# Patient Record
Sex: Male | Born: 1935 | Race: Black or African American | Hispanic: No | Marital: Married | Smoking: Current every day smoker
Health system: Southern US, Community
[De-identification: ages and names within clinical notes are randomized; demographics above are authoritative.]

## PROBLEM LIST (undated history)

## (undated) DIAGNOSIS — H269 Unspecified cataract: Secondary | ICD-10-CM

## (undated) DIAGNOSIS — C801 Malignant (primary) neoplasm, unspecified: Secondary | ICD-10-CM

## (undated) DIAGNOSIS — E785 Hyperlipidemia, unspecified: Secondary | ICD-10-CM

## (undated) DIAGNOSIS — I10 Essential (primary) hypertension: Secondary | ICD-10-CM

## (undated) HISTORY — DX: Hyperlipidemia, unspecified: E78.5

## (undated) HISTORY — PX: FRACTURE SURGERY: SHX138

## (undated) HISTORY — DX: Malignant (primary) neoplasm, unspecified: C80.1

## (undated) HISTORY — PX: CHOLECYSTECTOMY: SHX55

## (undated) HISTORY — PX: APPENDECTOMY: SHX54

## (undated) HISTORY — DX: Unspecified cataract: H26.9

---

## 2001-11-08 ENCOUNTER — Encounter: Payer: Self-pay | Admitting: Emergency Medicine

## 2001-11-08 ENCOUNTER — Encounter: Payer: Self-pay | Admitting: Internal Medicine

## 2001-11-08 ENCOUNTER — Inpatient Hospital Stay (HOSPITAL_COMMUNITY): Admission: EM | Admit: 2001-11-08 | Discharge: 2001-11-13 | Payer: Self-pay | Admitting: Emergency Medicine

## 2001-11-11 ENCOUNTER — Encounter: Payer: Self-pay | Admitting: Internal Medicine

## 2008-11-22 ENCOUNTER — Emergency Department (HOSPITAL_COMMUNITY): Admission: EM | Admit: 2008-11-22 | Discharge: 2008-11-23 | Payer: Self-pay | Admitting: Emergency Medicine

## 2010-01-04 ENCOUNTER — Ambulatory Visit (HOSPITAL_COMMUNITY): Payer: Self-pay | Admitting: Oncology

## 2010-01-04 ENCOUNTER — Encounter (HOSPITAL_COMMUNITY)
Admission: RE | Admit: 2010-01-04 | Discharge: 2010-02-03 | Payer: Self-pay | Source: Home / Self Care | Admitting: Oncology

## 2010-01-10 ENCOUNTER — Ambulatory Visit
Admission: RE | Admit: 2010-01-10 | Discharge: 2010-04-05 | Payer: Self-pay | Source: Home / Self Care | Attending: Radiation Oncology | Admitting: Radiation Oncology

## 2010-03-12 ENCOUNTER — Inpatient Hospital Stay (HOSPITAL_COMMUNITY)
Admission: EM | Admit: 2010-03-12 | Discharge: 2010-03-16 | Payer: Self-pay | Source: Home / Self Care | Attending: Family Medicine | Admitting: Family Medicine

## 2010-03-21 LAB — BASIC METABOLIC PANEL
BUN: 19 mg/dL (ref 6–23)
BUN: 19 mg/dL (ref 6–23)
BUN: 19 mg/dL (ref 6–23)
BUN: 13 mg/dL (ref 6–23)
CO2: 34 mEq/L — ABNORMAL HIGH (ref 19–32)
CO2: 32 mEq/L (ref 19–32)
CO2: 30 mEq/L (ref 19–32)
CO2: 31 mEq/L (ref 19–32)
Calcium: 10.6 mg/dL — ABNORMAL HIGH (ref 8.4–10.5)
Calcium: 10.1 mg/dL (ref 8.4–10.5)
Calcium: 9.8 mg/dL (ref 8.4–10.5)
Calcium: 8.6 mg/dL (ref 8.4–10.5)
Chloride: 84 mEq/L — ABNORMAL LOW (ref 96–112)
Chloride: 89 mEq/L — ABNORMAL LOW (ref 96–112)
Chloride: 90 mEq/L — ABNORMAL LOW (ref 96–112)
Chloride: 101 mEq/L (ref 96–112)
Creatinine, Ser: 1.46 mg/dL (ref 0.4–1.5)
Creatinine, Ser: 1.38 mg/dL (ref 0.4–1.5)
Creatinine, Ser: 1.1 mg/dL (ref 0.4–1.5)
Creatinine, Ser: 0.99 mg/dL (ref 0.4–1.5)
GFR calc Af Amer: 57 mL/min — ABNORMAL LOW (ref >60)
GFR calc Af Amer: >60 mL/min (ref >60)
GFR calc Af Amer: >60 mL/min (ref >60)
GFR calc Af Amer: >60 mL/min (ref >60)
GFR calc non Af Amer: 47 mL/min — ABNORMAL LOW (ref >60)
GFR calc non Af Amer: 51 mL/min — ABNORMAL LOW (ref >60)
GFR calc non Af Amer: >60 mL/min (ref >60)
GFR calc non Af Amer: >60 mL/min (ref >60)
Glucose, Bld: 537 mg/dL — ABNORMAL HIGH (ref 70–99)
Glucose, Bld: 246 mg/dL — ABNORMAL HIGH (ref 70–99)
Glucose, Bld: 235 mg/dL — ABNORMAL HIGH (ref 70–99)
Glucose, Bld: 177 mg/dL — ABNORMAL HIGH (ref 70–99)
Potassium: 4.5 mEq/L (ref 3.5–5.1)
Potassium: 3.4 mEq/L — ABNORMAL LOW (ref 3.5–5.1)
Potassium: 4 mEq/L (ref 3.5–5.1)
Potassium: 4.8 mEq/L (ref 3.5–5.1)
Sodium: 129 mEq/L — ABNORMAL LOW (ref 135–145)
Sodium: 134 mEq/L — ABNORMAL LOW (ref 135–145)
Sodium: 131 mEq/L — ABNORMAL LOW (ref 135–145)
Sodium: 137 mEq/L (ref 135–145)

## 2010-03-21 LAB — CBC
HCT: 42.6 % (ref 39.0–52.0)
Hemoglobin: 15.6 g/dL (ref 13.0–17.0)
MCH: 31.8 pg (ref 26.0–34.0)
MCHC: 36.6 g/dL — ABNORMAL HIGH (ref 30.0–36.0)
MCV: 86.9 fL (ref 78.0–100.0)
Platelets: 210 10*3/uL (ref 150–400)
RBC: 4.9 MIL/uL (ref 4.22–5.81)
RDW: 12.2 % (ref 11.5–15.5)
WBC: 10.2 10*3/uL (ref 4.0–10.5)

## 2010-03-21 LAB — HEPATIC FUNCTION PANEL
ALT: 15 U/L (ref 0–53)
AST: 17 U/L (ref 0–37)
Albumin: 3.8 g/dL (ref 3.5–5.2)
Alkaline Phosphatase: 86 U/L (ref 39–117)
Bilirubin, Direct: 0.2 mg/dL (ref 0.0–0.3)
Indirect Bilirubin: 0.8 mg/dL (ref 0.3–0.9)
Total Bilirubin: 1 mg/dL (ref 0.3–1.2)
Total Protein: 7.5 g/dL (ref 6.0–8.3)

## 2010-03-21 LAB — DIFFERENTIAL
Basophils Absolute: 0 10*3/uL (ref 0.0–0.1)
Basophils Relative: 0 % (ref 0–1)
Eosinophils Absolute: 0.1 10*3/uL (ref 0.0–0.7)
Eosinophils Relative: 1 % (ref 0–5)
Lymphocytes Relative: 15 % (ref 12–46)
Lymphs Abs: 1.5 10*3/uL (ref 0.7–4.0)
Monocytes Absolute: 0.5 10*3/uL (ref 0.1–1.0)
Monocytes Relative: 5 % (ref 3–12)
Neutro Abs: 8.1 10*3/uL — ABNORMAL HIGH (ref 1.7–7.7)
Neutrophils Relative %: 79 % — ABNORMAL HIGH (ref 43–77)

## 2010-03-21 LAB — GLUCOSE, CAPILLARY
Glucose-Capillary: >600 mg/dL (ref 70–99)
Glucose-Capillary: 398 mg/dL — ABNORMAL HIGH (ref 70–99)
Glucose-Capillary: 270 mg/dL — ABNORMAL HIGH (ref 70–99)
Glucose-Capillary: 206 mg/dL — ABNORMAL HIGH (ref 70–99)
Glucose-Capillary: 191 mg/dL — ABNORMAL HIGH (ref 70–99)
Glucose-Capillary: 194 mg/dL — ABNORMAL HIGH (ref 70–99)
Glucose-Capillary: 231 mg/dL — ABNORMAL HIGH (ref 70–99)
Glucose-Capillary: 120 mg/dL — ABNORMAL HIGH (ref 70–99)
Glucose-Capillary: 241 mg/dL — ABNORMAL HIGH (ref 70–99)
Glucose-Capillary: 321 mg/dL — ABNORMAL HIGH (ref 70–99)
Glucose-Capillary: 342 mg/dL — ABNORMAL HIGH (ref 70–99)
Glucose-Capillary: 141 mg/dL — ABNORMAL HIGH (ref 70–99)
Glucose-Capillary: 233 mg/dL — ABNORMAL HIGH (ref 70–99)
Glucose-Capillary: 273 mg/dL — ABNORMAL HIGH (ref 70–99)
Glucose-Capillary: 204 mg/dL — ABNORMAL HIGH (ref 70–99)
Glucose-Capillary: 208 mg/dL — ABNORMAL HIGH (ref 70–99)
Glucose-Capillary: 137 mg/dL — ABNORMAL HIGH (ref 70–99)

## 2010-03-21 LAB — URINE MICROSCOPIC-ADD ON

## 2010-03-21 LAB — COMPREHENSIVE METABOLIC PANEL
ALT: 11 U/L (ref 0–53)
AST: 16 U/L (ref 0–37)
Albumin: 2.5 g/dL — ABNORMAL LOW (ref 3.5–5.2)
Alkaline Phosphatase: 54 U/L (ref 39–117)
BUN: 6 mg/dL (ref 6–23)
CO2: 28 mEq/L (ref 19–32)
Calcium: 8.4 mg/dL (ref 8.4–10.5)
Chloride: 104 mEq/L (ref 96–112)
Creatinine, Ser: 0.78 mg/dL (ref 0.4–1.5)
GFR calc Af Amer: >60 mL/min (ref >60)
GFR calc non Af Amer: >60 mL/min (ref >60)
Glucose, Bld: 228 mg/dL — ABNORMAL HIGH (ref 70–99)
Potassium: 4.4 mEq/L (ref 3.5–5.1)
Sodium: 136 mEq/L (ref 135–145)
Total Bilirubin: 0.7 mg/dL (ref 0.3–1.2)
Total Protein: 5 g/dL — ABNORMAL LOW (ref 6.0–8.3)

## 2010-03-21 LAB — HEMOGLOBIN A1C
Hgb A1c MFr Bld: 16.6 % — ABNORMAL HIGH (ref <5.7)
Mean Plasma Glucose: 430 mg/dL — ABNORMAL HIGH (ref <117)

## 2010-03-21 LAB — URINALYSIS, ROUTINE W REFLEX MICROSCOPIC
Bilirubin Urine: NEGATIVE
Hgb urine dipstick: NEGATIVE
Leukocytes, UA: NEGATIVE
Nitrite: NEGATIVE
Protein, ur: NEGATIVE mg/dL
Specific Gravity, Urine: 1.015 (ref 1.005–1.030)
Urine Glucose, Fasting: >1000 mg/dL — AB
Urobilinogen, UA: 0.2 mg/dL (ref 0.0–1.0)
pH: 5.5 (ref 5.0–8.0)

## 2010-03-21 LAB — CK: Total CK: 105 U/L (ref 7–232)

## 2010-03-21 LAB — LIPASE, BLOOD: Lipase: 23 U/L (ref 11–59)

## 2010-03-21 LAB — RPR: RPR Ser Ql: NONREACTIVE

## 2010-03-21 LAB — PSA: PSA: 0.97 ng/mL (ref <=4.00)

## 2010-03-21 LAB — VITAMIN B12: Vitamin B-12: 234 pg/mL (ref 211–911)

## 2010-03-21 LAB — TSH: TSH: 1.156 u[IU]/mL (ref 0.350–4.500)

## 2010-03-21 LAB — KETONES, QUALITATIVE: Acetone, Bld: NEGATIVE

## 2010-04-06 ENCOUNTER — Ambulatory Visit: Payer: Medicare Other | Attending: Radiation Oncology | Admitting: Radiation Oncology

## 2010-04-06 DIAGNOSIS — Z510 Encounter for antineoplastic radiation therapy: Secondary | ICD-10-CM | POA: Insufficient documentation

## 2010-04-06 DIAGNOSIS — C61 Malignant neoplasm of prostate: Secondary | ICD-10-CM | POA: Insufficient documentation
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
## 2010-04-06 NOTE — Discharge Summary (Signed)
  NAME:  Jerry West, Jerry West NO.:  [id number]  MEDICAL RECORD NO.:  [id number]          PATIENT TYPE:  INP  LOCATION:  A330                          FACILITY:  [facility]  PHYSICIAN:  Melvyn Novas, MDDATE OF BIRTH:  [birth year]  DATE OF ADMISSION:  03/12/2010 DATE OF DISCHARGE:  01/11/2012LH                              DISCHARGE SUMMARY   The patient is a 76 year old black male with chronic noncompliance, prostatic carcinoma, undergoing radiation therapy which started approximately a week or 2 ago, type 2 diabetes, and history of hypertension.  The patient underwent his first radiation therapy with Dr. Dayton Scrape in [place], decided to check his glucose every year, he has stopped metformin a year ago, and apparently had glucose over 600, subsequently admitted to the hospital, who was found to be significantly hyperglycemic with nonketotic features.  He has polyuria and polydipsia. He also had a 35-pound inexplicable weight loss over the preceding year. Upper and lower endoscopy which revealed esophagitis and two polyps in transverse colon with some mild sigmoid diverticulosis.  There was no evidence of carcinoma.  Essentially, labs were within normal limits.  He was placed back on metformin with minimal effect upon glycemic control. Then, subsequently added Lantus 10 and 20 units at bedtime, his diabetic control was much improved, less polyuria and polydipsia.  This may also be a solution to his cryptic weight loss.  He subsequently was discharged on the following medicines; 1. Aspirin 81 mg per day. 2. Lisinopril 5 mg p.o. daily. 3. Lantus insulin 20 units at bedtime. 4. Metformin 500 mg p.o. b.i.d. 5. Jalyn 0.5/0.4 mg p.o. daily.  He will follow up with Dr. Dayton Scrape and Dr. Jerre Simon for prostatic carcinoma therapy.  He will follow up with me for diabetes and hypertension control.     Melvyn Novas, MD     RMD/MEDQ  D:  03/16/2010  T:   03/16/2010  Job:  [id number]  Electronically Signed by Oval Linsey MD on 04/06/2010 12:29:28 PM

## 2010-05-17 LAB — COMPREHENSIVE METABOLIC PANEL
ALT: 11 U/L (ref 0–53)
AST: 14 U/L (ref 0–37)
Albumin: 4.1 g/dL (ref 3.5–5.2)
Alkaline Phosphatase: 98 U/L (ref 39–117)
BUN: 16 mg/dL (ref 6–23)
CO2: 29 mEq/L (ref 19–32)
Calcium: 9.7 mg/dL (ref 8.4–10.5)
Chloride: 95 mEq/L — ABNORMAL LOW (ref 96–112)
Creatinine, Ser: 1.11 mg/dL (ref 0.4–1.5)
GFR calc Af Amer: >60 mL/min (ref >60)
GFR calc non Af Amer: >60 mL/min (ref >60)
Glucose, Bld: 516 mg/dL — ABNORMAL HIGH (ref 70–99)
Potassium: 4.2 mEq/L (ref 3.5–5.1)
Sodium: 132 mEq/L — ABNORMAL LOW (ref 135–145)
Total Bilirubin: 0.5 mg/dL (ref 0.3–1.2)
Total Protein: 7.8 g/dL (ref 6.0–8.3)

## 2010-05-17 LAB — DIFFERENTIAL
Basophils Absolute: 0 10*3/uL (ref 0.0–0.1)
Basophils Relative: 0 % (ref 0–1)
Eosinophils Absolute: 0.1 10*3/uL (ref 0.0–0.7)
Eosinophils Relative: 2 % (ref 0–5)
Lymphocytes Relative: 39 % (ref 12–46)
Lymphs Abs: 2 10*3/uL (ref 0.7–4.0)
Monocytes Absolute: 0.3 10*3/uL (ref 0.1–1.0)
Monocytes Relative: 6 % (ref 3–12)
Neutro Abs: 2.8 10*3/uL (ref 1.7–7.7)
Neutrophils Relative %: 53 % (ref 43–77)

## 2010-05-17 LAB — CBC
HCT: 43.7 % (ref 39.0–52.0)
Hemoglobin: 14.9 g/dL (ref 13.0–17.0)
MCH: 31.5 pg (ref 26.0–34.0)
MCHC: 34.1 g/dL (ref 30.0–36.0)
MCV: 92.5 fL (ref 78.0–100.0)
Platelets: 195 10*3/uL (ref 150–400)
RBC: 4.72 MIL/uL (ref 4.22–5.81)
RDW: 12.4 % (ref 11.5–15.5)
WBC: 5.2 10*3/uL (ref 4.0–10.5)

## 2010-05-17 LAB — PSA: PSA: 9.38 ng/mL — ABNORMAL HIGH (ref 0.10–4.00)

## 2010-05-24 ENCOUNTER — Ambulatory Visit: Payer: Medicare Other | Attending: Radiation Oncology | Admitting: Radiation Oncology

## 2010-06-21 ENCOUNTER — Ambulatory Visit (HOSPITAL_COMMUNITY): Payer: Medicare Other | Admitting: Oncology
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
## 2010-07-22 NOTE — Discharge Summary (Signed)
   NAME:  Jerry West, Jerry West NO.:  [id number]   MEDICAL RECORD NO.:  [id number]                   PATIENT TYPE:  INP   LOCATION:  A340                                 FACILITY:  [facility]   PHYSICIAN:  Dalia Heading, M.D.               DATE OF BIRTH:  Jul 29, 1934   DATE OF ADMISSION:  11/08/2001  DATE OF DISCHARGE:  11/13/2001                                 DISCHARGE SUMMARY   HOSPITAL COURSE:  The patient is a 76 year old black male who presented from  [facility] who presented with elevated liver enzyme  tests, amylase, and lipase all consistent with gallstone pancreatitis.  He  was admitted by Dr. Josefine Class of the hospitalists program for further  evaluation and treatment.  Ultrasound of the gallbladder did reveal a  nondistended gallbladder with gallbladder wall thickening.  There was a  question of small polyp or gallstone within the neck of the gallbladder.  CT  scan of the abdomen and pelvis revealed retroperitoneal inflammation and  fluid in the second portion of the duodenum consistent with pancreatitis.  A  surgery consultation was obtained and the patient ultimately went to the  operating room on November 11, 2001 and underwent laparoscopic  cholecystectomy with cholangiograms.  He tolerated the procedure well.  His  postoperative course was for the most part remarkable only for  hypophosphatemia.  His diet was advanced without difficulty once his bowel  function returned.  The patient was [facility] back to the correctional  in[facility] in fair and stable condition on November 13, 2001.   DISCHARGE INSTRUCTIONS:  The patient is to follow up with Dr. Franky Macho  on November 19, 2001.   DISCHARGE MEDICATIONS:  Vicodin, K-Dur, previous medications as prescribed.   PRINCIPAL DIAGNOSES:  1. Gallstone pancreatitis.  2. Non-insulin-dependent diabetes mellitus.  3. Hypertension.  4. Hypercholesterolemia.   PRINCIPAL  PROCEDURE:  Laparoscopic cholecystectomy with intraoperative  cholangiograms on November 11, 2001.                                               Dalia Heading, M.D.    MAJ/MEDQ  D:  12/07/2001  T:  12/09/2001  Job:  [id number]   cc:   Hanley Hays. Dechurch, M.D.  [address]  [place]  [place] [address]  Fax: [phone]
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
## 2010-07-22 NOTE — Op Note (Signed)
NAME:  Jerry West, Jerry West NO.:  [id number]   MEDICAL RECORD NO.:  [id number]                   PATIENT TYPE:  INP   LOCATION:  A340                                 FACILITY:  [facility]   PHYSICIAN:  Dalia Heading, M.D.               DATE OF BIRTH:  03/20/1934   DATE OF PROCEDURE:  11/11/2001  DATE OF DISCHARGE:                                 OPERATIVE REPORT   PREOPERATIVE DIAGNOSIS:  Gallstone pancreatitis, cholelithiasis.   POSTOPERATIVE DIAGNOSIS:  Gallstone pancreatitis, cholelithiasis.   PROCEDURE:  Laparoscopic cholecystectomy with cholangiograms.   SURGEON:  Dalia Heading, M.D.   ANESTHESIA:  General endotracheal   INDICATIONS:  The patient is a 76 year old black male who presents with  gallstone pancreatitis and cholelithiasis.  His liver enzyme tests, amylase,  and lipase have all returned to normal during his stay.  He now comes to the  operating room for laparoscopic cholecystectomy with cholangiograms.  The  risks and benefits of the procedure including bleeding, infection,  hepatobiliary injury, and the possibility of an open procedure were fully  explained to the patient, who gave informed consent.   DESCRIPTION OF PROCEDURE:  The patient was placed in the supine position.  After induction of general endotracheal anesthesia, the abdomen was prepped  and draped using the usual sterile technique with Betadine.   An supraumbilical incision was made down to the fascia.  A Veress needle was  introduced into the abdominal cavity and confirmation of placement was done  using the saline drop test.  The abdomen was then insufflated to 16 mmHg  pressure.  An 11-mm trocar was introduced into the abdominal cavity under  direct visualization without difficulty.  The patient was placed in reverse  Trendelenburg position and an additional 11-mm trocar was placed in the  epigastric region and 5-mm trocars were placed in the right upper quadrant  and right flank regions. In addition, a left-sided 11-mm trocar was placed  in order to place a pedal retractor due to a significant amount of omentum  and colonic distention.  The gallbladder was retracted superior and  laterally.   The dissection was begun around the infundibulum of the gallbladder.  The  cystic duct was first identified.  Its juncture to the infundibulum fully  identified.  A single Endoclip was placed proximally on the cystic duct.  An  incision was made into the cystic duct and the cholangiocatheter was  inserted.  Intraoperative fluoroscopic digital cholangiograms were  performed.  The hepatobiliary tree appeared to be within normal limits.  Some narrowing was noted towards the pancreatic portion of the common bile  duct, though no specific stricture was noted.  No intrahepatic defects were  noted.  The dye flowed freely into the duodenum.  The cholangiocatheter was  removed and multiple Endoclips were placed distally on the cystic duct and  the cystic duct was divided.   The cystic artery was likewise ligated  and divided.  The gallbladder was  freed away from the gallbladder fossa using Bovie electrocautery.  The  gallbladder was delivered through the epigastric trocar site using the  EndoCatch bag.  The gallbladder fossa was inspected and no abnormal bleeding  or bile leakage was noted.  Surgicel was placed in the gallbladder fossa,  the subhepatic space, as well as the right hepatic gutter were irrigated  with normal saline.  All fluid and air were then evacuated from the  abdominal cavity prior to removal of the trocars.   All wounds were irrigated with normal saline.  All wounds were injected with  0.5% Sensorcaine.  The supraumbilical fascia as well as epigastric fascia,  and left flank fascia were reapproximated using #0 Vicryl interrupted  suture. All skin incisions were closed using staples. Betadine ointment and  dry sterile dressings were applied.    All tape and needle counts correct at the end of the procedure.  The patient  was extubated in the operating room and went back to recovery room in awake  and stable condition.   COMPLICATIONS:  None.   SPECIMENS:  Gallbladder with stones.   BLOOD LOSS:  Minimal.                                               Dalia Heading, M.D.    MAJ/MEDQ  D:  11/11/2001  T:  11/12/2001  Job:  [id number]   cc:   Hanley Hays. Dechurch, M.D.  [address]  [place]  [place] [address]  Fax: [phone]

## 2011-07-24 ENCOUNTER — Emergency Department (HOSPITAL_COMMUNITY): Payer: Medicare HMO

## 2011-07-24 ENCOUNTER — Emergency Department (HOSPITAL_COMMUNITY)
Admission: EM | Admit: 2011-07-24 | Discharge: 2011-07-24 | Disposition: A | Discharge status: Home / Self Care | Payer: Medicare HMO | Attending: Emergency Medicine | Admitting: Emergency Medicine

## 2011-07-24 ENCOUNTER — Encounter (HOSPITAL_COMMUNITY): Payer: Self-pay | Admitting: *Deleted

## 2011-07-24 DIAGNOSIS — M1990 Unspecified osteoarthritis, unspecified site: Secondary | ICD-10-CM | POA: Insufficient documentation

## 2011-07-24 DIAGNOSIS — E119 Type 2 diabetes mellitus without complications: Secondary | ICD-10-CM | POA: Insufficient documentation

## 2011-07-24 MED ORDER — DEXAMETHASONE SODIUM PHOSPHATE 4 MG/ML IJ SOLN
8.0000 mg | Freq: Once | INTRAMUSCULAR | Status: AC
  Administered 2011-07-24: 8 mg via INTRAMUSCULAR
  Filled 2011-07-24: qty 2

## 2011-07-24 MED ORDER — COLCHICINE 0.6 MG PO TABS: 0.6000 mg | ORAL_TABLET | Freq: Every day | ORAL | Status: DC

## 2011-07-24 MED ORDER — HYDROCODONE-ACETAMINOPHEN 5-325 MG PO TABS: ORAL_TABLET | ORAL | Status: DC

## 2011-07-24 MED ORDER — ACETAMINOPHEN 500 MG PO TABS
1000.0000 mg | ORAL_TABLET | Freq: Once | ORAL | Status: AC
  Administered 2011-07-24: 1000 mg via ORAL
  Filled 2011-07-24: qty 2

## 2011-07-24 MED ORDER — COLCHICINE 0.6 MG PO TABS
1.2000 mg | ORAL_TABLET | Freq: Once | ORAL | Status: AC
  Administered 2011-07-24: 1.2 mg via ORAL
  Filled 2011-07-24: qty 2

## 2011-07-24 NOTE — ED Notes (Addendum)
Pain lt forearm since yesterday, no injury, swelling present.  Wrist and forearm are tender to palp

## 2011-07-24 NOTE — ED Provider Notes (Signed)
Patient relates she started getting painful swelling in his left wrist in the past couple days. He denies any recent injury. He denies any prior history of arthritis. He denies been on a diuretic such as thiazides. He denies prior history of gout.  Patient is alert and cooperative. He is noted to have some swelling noticeable around the ulnar aspect of his left wrist with some mild redness and warmth in the same area. He states he has pain on range of motion at the wrist. Appears to be consistent with acute gouty arthritis.  Medical screening examination/treatment/procedure(s) were conducted as a shared visit with non-physician practitioner(s) and myself.  I personally evaluated the patient during the encounter Devoria Albe, MD, Franz Dell, MD 07/24/11 2118

## 2011-07-24 NOTE — ED Provider Notes (Signed)
History     CSN: [id number]  Arrival date & time 07/24/11  1924   First MD Initiated Contact with Patient 07/24/11 2054      Chief Complaint  Patient presents with  . Arm Pain    (Consider location/radiation/quality/duration/timing/severity/associated sxs/prior treatment) Patient is a 77 y.o. male presenting with arm pain. The history is provided by the patient.  Arm Pain This is a new problem. The current episode started in the past 7 days. The problem occurs constantly. The problem has been gradually worsening. Associated symptoms include arthralgias and joint swelling. Pertinent negatives include no abdominal pain, anorexia, chest pain, coughing, fever, nausea or neck pain. Exacerbated by: movement of the left wrist and elbow. He has tried NSAIDs for the symptoms. The treatment provided no relief.    Past Medical History  Diagnosis Date  . Diabetes mellitus     Past Surgical History  Procedure Date  . Appendectomy   . Cholecystectomy   . Fracture surgery     History reviewed. No pertinent family history.  History  Substance Use Topics  . Smoking status: Current Everyday Smoker  . Smokeless tobacco: Not on file  . Alcohol Use: No      Review of Systems  Constitutional: Negative for fever and activity change.       All ROS Neg except as noted in HPI  HENT: Negative for nosebleeds and neck pain.   Eyes: Negative for photophobia and discharge.  Respiratory: Negative for cough, shortness of breath and wheezing.   Cardiovascular: Negative for chest pain and palpitations.  Gastrointestinal: Negative for nausea, abdominal pain, blood in stool and anorexia.  Genitourinary: Negative for dysuria, frequency and hematuria.  Musculoskeletal: Positive for joint swelling and arthralgias. Negative for back pain.  Skin: Negative.   Neurological: Negative for dizziness, seizures and speech difficulty.  Psychiatric/Behavioral: Negative for hallucinations and confusion.     Allergies  Review of patient's allergies indicates no known allergies.  Home Medications   Current Outpatient Rx  Name Route Sig Dispense Refill  . BC HEADACHE POWDER PO Oral Take 1 packet by mouth 4 (four) times daily as needed. For pain    . LISINOPRIL 5 MG PO TABS Oral Take 5 mg by mouth every morning.    Marland Kitchen METFORMIN HCL 500 MG PO TABS Oral Take 500 mg by mouth 2 (two) times daily with a meal.    . NAPROXEN SODIUM 220 MG PO CAPS Oral Take 2 capsules by mouth as needed.      BP 165/82  Pulse 76  Temp(Src) 97.5 F (36.4 C) (Oral)  Resp 16  Ht 5\' 10"  (1.778 m)  Wt 178 lb (80.74 kg)  BMI 25.54 kg/m2  SpO2 100%  Physical Exam  Nursing note and vitals reviewed. Constitutional: He is oriented to person, place, and time. He appears well-developed and well-nourished.  Non-toxic appearance.  HENT:  Head: Normocephalic.  Right Ear: Tympanic membrane and external ear normal.  Left Ear: Tympanic membrane and external ear normal.  Eyes: EOM and lids are normal. Pupils are equal, round, and reactive to light.  Neck: Normal range of motion. Neck supple. Carotid bruit is not present.  Cardiovascular: Normal rate, regular rhythm, intact distal pulses and normal pulses.   Murmur heard. Pulmonary/Chest: Breath sounds normal. No respiratory distress.  Abdominal: Soft. Bowel sounds are normal. There is no tenderness. There is no guarding.  Musculoskeletal: Normal range of motion.       Soreness with range of motion of the  left shoulder. Pain with range of motion of the left elbow, and left wrist. The left wrist is warm to touch, there is also moderate swelling present. There is good capillary refill of the fingers of the left hand. Radial pulses are symmetrical. Sensory is symmetrical.  Lymphadenopathy:       Head (right side): No submandibular adenopathy present.       Head (left side): No submandibular adenopathy present.    He has no cervical adenopathy.  Neurological: He is alert and  oriented to person, place, and time. He has normal strength. No cranial nerve deficit or sensory deficit.  Skin: Skin is warm and dry.  Psychiatric: He has a normal mood and affect. His speech is normal.    ED Course  Procedures (including critical care time)  Labs Reviewed - No data to display Dg Elbow Complete Left  07/24/2011  *RADIOLOGY REPORT*  Clinical Data: Left elbow pain.  LEFT ELBOW - COMPLETE 3+ VIEW  Comparison: None.  Findings: No acute fracture, dislocation or joint effusion identified.  Mild degenerative changes are seen primarily involving the proximal ulna.  No soft tissue abnormalities.  IMPRESSION: No acute findings.  Original Report Authenticated By: Reola Calkins, M.D.   Dg Forearm Left  07/24/2011  *RADIOLOGY REPORT*  Clinical Data: Left forearm pain.  LEFT FOREARM - 2 VIEW  Comparison:  None.  Findings: There is no evidence of fracture or other focal bone lesions.  Soft tissues are unremarkable.  IMPRESSION: Negative.  Original Report Authenticated By: Reola Calkins, M.D.     No diagnosis found.    MDM  I have reviewed nursing notes, vital signs, and all appropriate lab and imaging results for this patient. Pt seen with me by Dr Lynelle Doctor. Xray of the left elbow is negative. Xray of lthe left wrist is negative. Pt fitted with a sling. Treated with colchicine and norco.       Kathie Dike, PA 07/24/11 2120

## 2011-07-24 NOTE — Discharge Instructions (Signed)
Please use the sling as much as possible. Do not use while driving. Colchicine daily after a meal. Norco for pain, this may cause drowsiness, use with caution. Please see Dr Hilda Lias for orthopedic evaluation if not improving.Degenerative Arthritis You have osteoarthritis. This is the wear and tear arthritis that comes with aging. It is also called degenerative arthritis. This is common in people past middle age. It is caused by stress on the joints. The large weight bearing joints of the lower extremities are most often affected. The knees, hips, back, neck, and hands can become painful, swollen, and stiff. This is the most common type of arthritis. It comes on with age, carrying too much weight, or from an injury. Treatment includes resting the sore joint until the pain and swelling improve. Crutches or a walker may be needed for severe flares. Only take over-the-counter or prescription medicines for pain, discomfort, or fever as directed by your caregiver. Local heat therapy may improve motion. Cortisone shots into the joint are sometimes used to reduce pain and swelling during flares. Osteoarthritis is usually not crippling and progresses slowly. There are things you can do to decrease pain:  Avoid high impact activities.   Exercise regularly.   Low impact exercises such as walking, biking and swimming help to keep the muscles strong and keep normal joint function.   Stretching helps to keep your range of motion.   Lose weight if you are overweight. This reduces joint stress.  In severe cases when you have pain at rest or increasing disability, joint surgery may be helpful. See your caregiver for follow-up treatment as recommended.  SEEK IMMEDIATE MEDICAL CARE IF:   You have severe joint pain.   Marked swelling and redness in your joint develops.   You develop a high fever.  Document Released: 02/20/2005 Document Revised: 02/09/2011 Document Reviewed: 07/23/2006 [facility] Patient  Information 2012 [place], [place].

## 2011-07-25 NOTE — ED Provider Notes (Signed)
Medical screening examination/treatment/procedure(s) were performed by non-physician practitioner and as supervising physician I was immediately available for consultation/collaboration. Devoria Albe, MD, FACEP   Ward Givens, MD 07/25/11 [phone]

## 2014-11-30 DIAGNOSIS — E1165 Type 2 diabetes mellitus with hyperglycemia: Secondary | ICD-10-CM | POA: Diagnosis not present

## 2014-11-30 DIAGNOSIS — R5383 Other fatigue: Secondary | ICD-10-CM | POA: Diagnosis not present

## 2014-11-30 DIAGNOSIS — I110 Hypertensive heart disease with heart failure: Secondary | ICD-10-CM | POA: Diagnosis not present

## 2014-11-30 DIAGNOSIS — R972 Elevated prostate specific antigen [PSA]: Secondary | ICD-10-CM | POA: Diagnosis not present

## 2014-11-30 DIAGNOSIS — R5382 Chronic fatigue, unspecified: Secondary | ICD-10-CM | POA: Diagnosis not present

## 2014-11-30 DIAGNOSIS — D510 Vitamin B12 deficiency anemia due to intrinsic factor deficiency: Secondary | ICD-10-CM | POA: Diagnosis not present

## 2014-11-30 DIAGNOSIS — E784 Other hyperlipidemia: Secondary | ICD-10-CM | POA: Diagnosis not present

## 2014-11-30 DIAGNOSIS — N529 Male erectile dysfunction, unspecified: Secondary | ICD-10-CM | POA: Diagnosis not present

## 2014-11-30 DIAGNOSIS — I119 Hypertensive heart disease without heart failure: Secondary | ICD-10-CM | POA: Diagnosis not present

## 2016-09-27 ENCOUNTER — Emergency Department (HOSPITAL_COMMUNITY)
Admission: EM | Admit: 2016-09-27 | Discharge: 2016-09-27 | Disposition: A | Discharge status: Home / Self Care | Payer: Medicare HMO | Attending: Emergency Medicine | Admitting: Emergency Medicine

## 2016-09-27 ENCOUNTER — Encounter (HOSPITAL_COMMUNITY): Payer: Self-pay | Admitting: *Deleted

## 2016-09-27 DIAGNOSIS — Z7984 Long term (current) use of oral hypoglycemic drugs: Secondary | ICD-10-CM | POA: Diagnosis not present

## 2016-09-27 DIAGNOSIS — E119 Type 2 diabetes mellitus without complications: Secondary | ICD-10-CM | POA: Diagnosis not present

## 2016-09-27 DIAGNOSIS — F17210 Nicotine dependence, cigarettes, uncomplicated: Secondary | ICD-10-CM | POA: Insufficient documentation

## 2016-09-27 DIAGNOSIS — L249 Irritant contact dermatitis, unspecified cause: Secondary | ICD-10-CM | POA: Diagnosis not present

## 2016-09-27 DIAGNOSIS — I10 Essential (primary) hypertension: Secondary | ICD-10-CM | POA: Diagnosis not present

## 2016-09-27 DIAGNOSIS — R21 Rash and other nonspecific skin eruption: Secondary | ICD-10-CM

## 2016-09-27 DIAGNOSIS — Z79899 Other long term (current) drug therapy: Secondary | ICD-10-CM | POA: Diagnosis not present

## 2016-09-27 HISTORY — DX: Essential (primary) hypertension: I10

## 2016-09-27 MED ORDER — HYDROCORTISONE 1 % EX CREA: TOPICAL_CREAM | CUTANEOUS | 0 refills | Status: DC

## 2016-09-27 MED ORDER — HYDROXYZINE HCL 25 MG PO TABS: 25.0000 mg | ORAL_TABLET | Freq: Four times a day (QID) | ORAL | 0 refills | Status: DC

## 2016-09-27 NOTE — Discharge Instructions (Signed)
Apply steroid cream twice daily. Follow-up with your primary physician if symptoms do not improve.

## 2016-09-27 NOTE — ED Triage Notes (Signed)
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

## 2016-09-27 NOTE — ED Provider Notes (Signed)
[facility] Provider Note   CSN: [id number] Arrival date & time: 09/27/16  0057     History   Chief Complaint Chief Complaint  Patient presents with  . Rash    HPI [facility] is a 82 y.o. male.  HPI  This is a 82 year old male with a history of diabetes and hypertension who presents with facial rash. Patient reports 2 to three-day history of worsening itchy facial rash most notably over the chin and cheek. Patient states that he was working in the yard and uses a rag to draw his face from sweat. He thinks he may have gotten into poison ivy. However, he does not have any notable rash on his arms or legs. He states the rash is itchy. He has been using an anti-itch cream which makes the rash burn. He denies any other allergic symptoms including facial swelling, shortness of breath, wheezing.  Past Medical History:  Diagnosis Date  . Diabetes mellitus   . Hypertension     There are no active problems to display for this patient.   Past Surgical History:  Procedure Laterality Date  . APPENDECTOMY    . CHOLECYSTECTOMY    . FRACTURE SURGERY         Home Medications    Prior to Admission medications   Medication Sig Start Date End Date Taking? Authorizing Provider  lisinopril (PRINIVIL,ZESTRIL) 5 MG tablet Take 5 mg by mouth every morning.   Yes [provider]  metFORMIN (GLUCOPHAGE) 500 MG tablet Take 500 mg by mouth 2 (two) times daily with a meal.   Yes [provider]  Aspirin-Salicylamide-Caffeine (BC HEADACHE POWDER PO) Take 1 packet by mouth 4 (four) times daily as needed. For pain    [provider]  hydrocortisone cream 1 % Apply to affected area 2 times daily 09/27/16   Marino Rogerson, [facility], MD  hydrOXYzine (ATARAX/VISTARIL) 25 MG tablet Take 1 tablet (25 mg total) by mouth every 6 (six) hours. 09/27/16   [facility], [facility] Hair, MD  Naproxen Sodium (ALEVE) 220 MG CAPS Take 2 capsules by mouth as needed.    [provider]    Family History History reviewed. No pertinent family history.  Social History Social History  Substance Use Topics  . Smoking status: Current Every Day Smoker    Packs/day: 0.75    Types: Cigarettes  . Smokeless tobacco: Never Used  . Alcohol use No     Allergies   Patient has no known allergies.   Review of Systems Review of Systems  Constitutional: Negative for fever.  HENT: Negative for facial swelling and trouble swallowing.   Respiratory: Negative for shortness of breath.   Cardiovascular: Negative for chest pain.  Skin: Positive for rash.  All other systems reviewed and are negative.    Physical Exam Updated Vital Signs BP (!) 161/86 (BP Location: Left Arm)   Pulse 67   Temp 98.1 F (36.7 C) (Oral)   Resp 18   Ht 5' 10.5" (1.791 m)   Wt 81.6 kg (180 lb)   SpO2 97%   BMI 25.46 kg/m   Physical Exam  Constitutional: He is oriented to person, place, and time. He appears well-developed and well-nourished.  Well-appearing for age, no acute distress  HENT:  Head: Normocephalic and atraumatic.  Mouth/Throat: Oropharynx is clear and moist.  No oropharyngeal lesions noted  Cardiovascular: Normal rate, regular rhythm and normal heart sounds.   No murmur heard. Pulmonary/Chest: Effort normal and breath sounds normal.  No respiratory distress. He has no wheezes.  Neurological: He is alert and oriented to person, place, and time.  Skin: Skin is warm and dry.  Erythematous rash most notably over the chin and within the beard line of his face, small less than 2 mm vesicles noted  Psychiatric: He has a normal mood and affect.  Nursing note and vitals reviewed.    ED Treatments / Results  Labs (all labs ordered are listed, but only abnormal results are displayed) Labs Reviewed - No data to display  EKG  EKG Interpretation None       Radiology No results found.  Procedures Procedures (including critical care time)  Medications  Ordered in ED Medications - No data to display   Initial Impression / Assessment and Plan / ED Course  I have reviewed the triage vital signs and the nursing notes.  Pertinent labs & imaging results that were available during my care of the patient were reviewed by me and considered in my medical decision making (see chart for details).     Patient presents with a rash to face. It does appear to be contact dermatitis. Unclear irritant but poison ivy is a consideration. However, he does not have rash anywhere else. Recommend low-dose steroid cream and Atarax for itching. Follow-up with primary physician. He has no other systemic symptoms or mucosal lesions to suggest systemic illness.  After history, exam, and medical workup I feel the patient has been appropriately medically screened and is safe for discharge home. Pertinent diagnoses were discussed with the patient. Patient was given return precautions.   Final Clinical Impressions(s) / ED Diagnoses   Final diagnoses:  Irritant contact dermatitis, unspecified trigger  Rash    New Prescriptions New Prescriptions   HYDROCORTISONE CREAM 1 %    Apply to affected area 2 times daily   HYDROXYZINE (ATARAX/VISTARIL) 25 MG TABLET    Take 1 tablet (25 mg total) by mouth every 6 (six) hours.     Jerry Hacker, MD 09/27/16 [phone]

## 2016-10-05 ENCOUNTER — Encounter (HOSPITAL_COMMUNITY): Payer: Self-pay | Admitting: *Deleted

## 2016-10-05 ENCOUNTER — Emergency Department (HOSPITAL_COMMUNITY)
Admission: EM | Admit: 2016-10-05 | Discharge: 2016-10-05 | Disposition: A | Discharge status: Home / Self Care | Payer: Medicare HMO | Attending: Emergency Medicine | Admitting: Emergency Medicine

## 2016-10-05 DIAGNOSIS — Z79899 Other long term (current) drug therapy: Secondary | ICD-10-CM | POA: Insufficient documentation

## 2016-10-05 DIAGNOSIS — R21 Rash and other nonspecific skin eruption: Secondary | ICD-10-CM | POA: Diagnosis present

## 2016-10-05 DIAGNOSIS — L739 Follicular disorder, unspecified: Secondary | ICD-10-CM | POA: Insufficient documentation

## 2016-10-05 DIAGNOSIS — E119 Type 2 diabetes mellitus without complications: Secondary | ICD-10-CM | POA: Diagnosis not present

## 2016-10-05 DIAGNOSIS — L0201 Cutaneous abscess of face: Secondary | ICD-10-CM | POA: Diagnosis not present

## 2016-10-05 DIAGNOSIS — F17210 Nicotine dependence, cigarettes, uncomplicated: Secondary | ICD-10-CM | POA: Insufficient documentation

## 2016-10-05 DIAGNOSIS — Z7984 Long term (current) use of oral hypoglycemic drugs: Secondary | ICD-10-CM | POA: Insufficient documentation

## 2016-10-05 DIAGNOSIS — I10 Essential (primary) hypertension: Secondary | ICD-10-CM | POA: Insufficient documentation

## 2016-10-05 LAB — CBG MONITORING, ED: GLUCOSE-CAPILLARY: 94 mg/dL (ref 65–99)

## 2016-10-05 MED ORDER — HYDROCODONE-ACETAMINOPHEN 5-325 MG PO TABS: 1.0000 | ORAL_TABLET | ORAL | 0 refills | Status: DC | PRN

## 2016-10-05 MED ORDER — LIDOCAINE HCL (PF) 2 % IJ SOLN: 2.0000 mL | Freq: Once | INTRAMUSCULAR | Status: DC

## 2016-10-05 MED ORDER — LIDOCAINE HCL (PF) 1 % IJ SOLN
5.0000 mL | Freq: Once | INTRAMUSCULAR | Status: AC
  Administered 2016-10-05: 5 mL via INTRADERMAL
  Filled 2016-10-05: qty 5

## 2016-10-05 MED ORDER — SULFAMETHOXAZOLE-TRIMETHOPRIM 800-160 MG PO TABS: 1.0000 | ORAL_TABLET | Freq: Two times a day (BID) | ORAL | 0 refills | Status: AC

## 2016-10-05 NOTE — ED Triage Notes (Signed)
CALLED FOR ROOM, OUTSIDE SMOKING

## 2016-10-05 NOTE — Discharge Instructions (Signed)
Take the entire course of antibiotics as prescribed.  You may take the hydrocodone prescribed for pain relief.  This will make you drowsy - do not drive within 4 hours of taking this medication.  Apply warm compresses at least 3 times daily for 15 minute episodes.  Get rechecked for any new or worsening infection.

## 2016-10-06 NOTE — ED Provider Notes (Signed)
[facility] DEPT Provider Note   CSN: [id number] Arrival date & time: 10/05/16  1258     History   Chief Complaint Chief Complaint  Patient presents with  . Rash    HPI [facility] is a 82 y.o. male with a history of DM and HTN presenting with a facial outbreak in his beard area, with multiple papules and one larger tender raised lesion.  He denies a history of similar sx.  He uses a "high end" razor for shaving and changes the blade every other use.  He has applied warm compresses to the site without improvement or drainage.  Denies any other symptoms including headache, fevers, nausea.    HPI  Past Medical History:  Diagnosis Date  . Diabetes mellitus   . Hypertension     There are no active problems to display for this patient.   Past Surgical History:  Procedure Laterality Date  . APPENDECTOMY    . CHOLECYSTECTOMY    . FRACTURE SURGERY         Home Medications    Prior to Admission medications   Medication Sig Start Date End Date Taking? Authorizing Provider  Aspirin-Salicylamide-Caffeine (BC HEADACHE POWDER PO) Take 1 packet by mouth 4 (four) times daily as needed. For pain   Yes [provider]  hydrocortisone cream 1 % Apply to affected area 2 times daily 09/27/16  Yes Horton, Barbette Hair, MD  hydrOXYzine (ATARAX/VISTARIL) 25 MG tablet Take 1 tablet (25 mg total) by mouth every 6 (six) hours. 09/27/16  Yes Horton, Barbette Hair, MD  lisinopril (PRINIVIL,ZESTRIL) 5 MG tablet Take 5 mg by mouth every morning.   Yes [provider]  metFORMIN (GLUCOPHAGE) 500 MG tablet Take 500 mg by mouth 2 (two) times daily with a meal.   Yes [provider]  Naproxen Sodium (ALEVE) 220 MG CAPS Take 2 capsules by mouth as needed.   Yes [provider]  HYDROcodone-acetaminophen (NORCO/VICODIN) 5-325 MG tablet Take 1 tablet by mouth every 4 (four) hours as needed for severe pain. 10/05/16   Evalee Jefferson, PA-C  sulfamethoxazole-trimethoprim  (BACTRIM DS,SEPTRA DS) 800-160 MG tablet Take 1 tablet by mouth 2 (two) times daily. 10/05/16 10/12/16  Evalee Jefferson, PA-C    Family History No family history on file.  Social History Social History  Substance Use Topics  . Smoking status: Current Every Day Smoker    Packs/day: 0.75    Types: Cigarettes  . Smokeless tobacco: Never Used  . Alcohol use No     Allergies   Patient has no known allergies.   Review of Systems Review of Systems  Constitutional: Negative for chills and fever.  Respiratory: Negative for shortness of breath and wheezing.   Skin: Positive for rash.  Neurological: Negative for numbness.     Physical Exam Updated Vital Signs BP (!) 150/63 (BP Location: Right Arm)   Pulse 80   Temp 98.2 F (36.8 C) (Oral)   Resp 19   Ht 5' 10.5" (1.791 m)   Wt 78.9 kg (174 lb)   SpO2 97%   BMI 24.61 kg/m   Physical Exam  Constitutional: He appears well-developed and well-nourished. No distress.  HENT:  Head: Normocephalic.  Neck: Neck supple.  Cardiovascular: Normal rate.   Pulmonary/Chest: Effort normal. He has no wheezes.  Musculoskeletal: Normal range of motion. He exhibits no edema.  Lymphadenopathy:       Head (right side): No submental and no submandibular adenopathy present.  Head (left side): No submental and no submandibular adenopathy present.    He has no cervical adenopathy.  Skin:  Several scattered papules within beard area, one which is enlarged, approx. 2 cm in diameter, raised, tenting without drainage at his lateral anterior mandible.  No surrounding erythema.       ED Treatments / Results  Labs (all labs ordered are listed, but only abnormal results are displayed) Labs Reviewed  CBG MONITORING, ED    EKG  EKG Interpretation None       Radiology No results found.  Procedures Procedures (including critical care time)  INCISION AND DRAINAGE Performed by: Evalee Jefferson Consent: Verbal consent obtained. Risks and  benefits: risks, benefits and alternatives were discussed Type: abscess  Body area: left mandible  Anesthesia: local infiltration  Incision was made with a scalpel.  Local anesthetic: lidocaine 1% without epinephrine  Anesthetic total: 2 ml  Complexity: complex Blunt dissection to break up loculations  Drainage: purulent  Drainage amount: moderate  Packing material: none  Patient tolerance: Patient tolerated the procedure well with no immediate complications.     Medications Ordered in ED Medications  lidocaine (PF) (XYLOCAINE) 1 % injection 5 mL (5 mLs Intradermal Given 10/05/16 1545)     Initial Impression / Assessment and Plan / ED Course  I have reviewed the triage vital signs and the nursing notes.  Pertinent labs & imaging results that were available during my care of the patient were reviewed by me and considered in my medical decision making (see chart for details).     Advised continued warm compresses, avoid squeezing areas of infection, bactrim, few hydrocodone, f/u with pcp (Dr. Moshe Cipro) for recheck for any problems or concerns.  Final Clinical Impressions(s) / ED Diagnoses   Final diagnoses:  Folliculitis  Facial abscess    New Prescriptions Discharge Medication List as of 10/05/2016  3:48 PM    START taking these medications   Details  HYDROcodone-acetaminophen (NORCO/VICODIN) 5-325 MG tablet Take 1 tablet by mouth every 4 (four) hours as needed for severe pain., Starting Thu 10/05/2016, Print    sulfamethoxazole-trimethoprim (BACTRIM DS,SEPTRA DS) 800-160 MG tablet Take 1 tablet by mouth 2 (two) times daily., Starting Thu 10/05/2016, Until Thu 10/12/2016, Print         Evalee Jefferson, PA-C 10/06/16 [place], [place], DO 10/09/16 [id number]

## 2016-10-09 ENCOUNTER — Other Ambulatory Visit: Payer: Self-pay | Admitting: Family Medicine

## 2016-10-10 ENCOUNTER — Other Ambulatory Visit: Payer: Self-pay | Admitting: Family Medicine

## 2016-12-19 ENCOUNTER — Encounter: Payer: Self-pay | Admitting: Family Medicine

## 2016-12-19 ENCOUNTER — Ambulatory Visit (INDEPENDENT_AMBULATORY_CARE_PROVIDER_SITE_OTHER): Payer: Medicare HMO | Admitting: Family Medicine

## 2016-12-19 DIAGNOSIS — Z720 Tobacco use: Secondary | ICD-10-CM

## 2016-12-19 DIAGNOSIS — Z8546 Personal history of malignant neoplasm of prostate: Secondary | ICD-10-CM

## 2016-12-19 DIAGNOSIS — F1021 Alcohol dependence, in remission: Secondary | ICD-10-CM

## 2016-12-19 DIAGNOSIS — I10 Essential (primary) hypertension: Secondary | ICD-10-CM | POA: Diagnosis not present

## 2016-12-19 DIAGNOSIS — R739 Hyperglycemia, unspecified: Secondary | ICD-10-CM

## 2016-12-19 HISTORY — DX: Personal history of malignant neoplasm of prostate: Z85.46

## 2016-12-19 HISTORY — DX: Hyperglycemia, unspecified: R73.9

## 2016-12-19 HISTORY — DX: Tobacco use: Z72.0

## 2016-12-19 HISTORY — DX: Alcohol dependence, in remission: F10.21

## 2016-12-19 MED ORDER — LISINOPRIL 5 MG PO TABS: 5.0000 mg | ORAL_TABLET | ORAL | 3 refills | Status: DC

## 2016-12-19 NOTE — Progress Notes (Signed)
Chief Complaint  Patient presents with  . Diabetes   New patient No old records State has hypertension, is on lisinopril 5 mg daily States "used to have diabetes".  Brings in old bottle metformin.   Hs not taken in "years".  Denies any eye, renal or foot complications from diabetes.  Denies any thirst or polyuria.  Not on any diet. I have discussed the multiple health risks associated with cigarette smoking including, but not limited to, cardiovascular disease, lung disease and cancer.  I have strongly recommended that smoking be stopped.  I have reviewed the various methods of quitting including cold [place], classes, nicotine replacements and prescription medications.  I have offered assistance in this difficult process.  The patient is not interested in assistance at this time. Denies any cardiovascular or heart disease. Prior alcoholic and had mult driving offenses resulting in long incarceration.  No drinking since went in jail 1997.  Discharged in 2008. History of prostate cancer.  Treated with "seeds" States was screened for colon cancer  Refuses immunizations    Patient Active Problem List   Diagnosis Date Noted  . Tobacco abuse 12/19/2016  . Essential hypertension 12/19/2016  . Elevated blood sugar 12/19/2016  . History of prostate cancer 12/19/2016  . Alcoholism in remission ([place]) 12/19/2016    Outpatient Encounter Prescriptions as of 12/19/2016  Medication Sig  . lisinopril (PRINIVIL,ZESTRIL) 5 MG tablet Take 1 tablet (5 mg total) by mouth every morning.  . metFORMIN (GLUCOPHAGE) 500 MG tablet Take 500 mg by mouth 2 (two) times daily with a meal.   No facility-administered encounter medications on file as of 12/19/2016.     Past Medical History:  Diagnosis Date  . Cancer [facility])    prostate  . Cataract   . Diabetes mellitus   . Hypertension     Past Surgical History:  Procedure Laterality Date  . APPENDECTOMY    . CHOLECYSTECTOMY    . FRACTURE SURGERY       Social History   Social History  . Marital status: Married    Spouse name: Enid Derry  . Number of children: 3  . Years of education: 6   Occupational History  . retired     Designer, television/film set, [employer] work   Social History Main Topics  . Smoking status: Current Every Day Smoker    Packs/day: 0.75    Types: Cigarettes    Start date: 03/06/1946  . Smokeless tobacco: Never Used  . Alcohol use No     Comment: prior alcoholic  . Drug use: No  . Sexual activity: Not Currently   Other Topics Concern  . Not on file   Social History Narrative   6th grade education   Reads "pretty good"   Incarcerated for alcoholism offenses - DUI.  No driving   Out of jail since 2008   Lives with second wife of 30+ years       Family History  Problem Relation Age of Onset  . Stroke Mother   . Alzheimer's disease Father   . Alcohol abuse Father   . Arthritis Father     Review of Systems  Constitutional: Negative for chills, fever and weight loss.  HENT: Negative for congestion and hearing loss.   Eyes: Negative for blurred vision and pain.  Respiratory: Negative for cough and shortness of breath.   Cardiovascular: Negative for chest pain and leg swelling.  Gastrointestinal: Negative for abdominal pain, constipation, diarrhea and heartburn.  Genitourinary: Negative for dysuria and frequency.  Nocturia X2  Musculoskeletal: Negative for falls, joint pain and myalgias.  Neurological: Negative for dizziness, seizures and headaches.  Psychiatric/Behavioral: Negative for depression. The patient is not nervous/anxious and does not have insomnia.     BP (!) 148/82 (BP Location: Right Arm, Patient Position: Sitting, Cuff Size: Normal)   Pulse 76   Temp (!) 97.4 F (36.3 C) (Temporal)   Resp 18   Ht 5\' 11"  (1.803 m)   Wt 169 lb 1.9 oz (76.7 kg)   SpO2 95%   BMI 23.59 kg/m   Physical Exam  Constitutional: He is oriented to person, place, and time. He appears well-developed and well-nourished.   HENT:  Head: Normocephalic and atraumatic.  Mouth/Throat: Oropharynx is clear and moist.  dentures  Eyes: Pupils are equal, round, and reactive to light. Conjunctivae are normal.  Neck: Normal range of motion. Neck supple.  Cardiovascular: Normal rate, regular rhythm and normal heart sounds.   Pulmonary/Chest: Effort normal and breath sounds normal. No respiratory distress.  Musculoskeletal: Normal range of motion. He exhibits no edema.  Lymphadenopathy:    He has no cervical adenopathy.  Neurological: He is alert and oriented to person, place, and time.  Gait normal  Skin: Skin is warm and dry.  Early clubbing  Psychiatric: He has a normal mood and affect. His behavior is normal. Thought content normal.  Nursing note and vitals reviewed. ASSESSMENT/PLAN:   1. Tobacco abuse Refuses counseling  2. Essential hypertension controlled - Lipid panel  3. Elevated blood sugar Will check status with new labs - CBC - COMPLETE METABOLIC PANEL WITH GFR - Hemoglobin A1c - Microalbumin / creatinine urine ratio  4. History of prostate cancer treated - PSA  5. Alcoholism in remission ([place])  Greater than 50% of this visit was spent in counseling and coordinating care.  Total face to face time:  45 minutes obtaining history and discussing heath risks of smoking.  Encouraged immunizations.    Patient Instructions  Stay on the lisinopril for blood pressure Get labs today  See me in one month  Need old records Dr Lorriane Shire   Raylene Everts, MD

## 2016-12-19 NOTE — Patient Instructions (Signed)
Stay on the lisinopril for blood pressure Get labs today  See me in one month  Need old records Dr Lorriane Shire

## 2016-12-20 ENCOUNTER — Encounter: Payer: Self-pay | Admitting: Family Medicine

## 2016-12-20 LAB — CBC
HCT: 43.4 % (ref 38.5–50.0)
Hemoglobin: 14.9 g/dL (ref 13.2–17.1)
MCH: 30.5 pg (ref 27.0–33.0)
MCHC: 34.3 g/dL (ref 32.0–36.0)
MCV: 88.9 fL (ref 80.0–100.0)
MPV: 10.5 fL (ref 7.5–12.5)
Platelets: 249 10*3/uL (ref 140–400)
RBC: 4.88 10*6/uL (ref 4.20–5.80)
RDW: 13.1 % (ref 11.0–15.0)
WBC: 3.3 10*3/uL — AB (ref 3.8–10.8)

## 2016-12-20 LAB — COMPLETE METABOLIC PANEL WITH GFR
AG RATIO: 1.3 (calc) (ref 1.0–2.5)
ALKALINE PHOSPHATASE (APISO): 68 U/L (ref 40–115)
ALT: 4 U/L — ABNORMAL LOW (ref 9–46)
AST: 10 U/L (ref 10–35)
Albumin: 4.1 g/dL (ref 3.6–5.1)
BILIRUBIN TOTAL: 1.1 mg/dL (ref 0.2–1.2)
BUN/Creatinine Ratio: 9 (calc) (ref 6–22)
BUN: 11 mg/dL (ref 7–25)
CHLORIDE: 103 mmol/L (ref 98–110)
CO2: 35 mmol/L — AB (ref 20–32)
Calcium: 9.6 mg/dL (ref 8.6–10.3)
Creat: 1.16 mg/dL — ABNORMAL HIGH (ref 0.70–1.11)
GFR, Est African American: 69 mL/min/{1.73_m2} (ref >=60)
GFR, Est Non African American: 59 mL/min/{1.73_m2} — ABNORMAL LOW (ref >=60)
GLOBULIN: 3.1 g/dL (ref 1.9–3.7)
Glucose, Bld: 90 mg/dL (ref 65–99)
Potassium: 4.6 mmol/L (ref 3.5–5.3)
SODIUM: 142 mmol/L (ref 135–146)
Total Protein: 7.2 g/dL (ref 6.1–8.1)

## 2016-12-20 LAB — PSA: PSA: 0.6 ng/mL (ref <=4.0)

## 2016-12-20 LAB — LIPID PANEL
CHOLESTEROL: 228 mg/dL — AB (ref <200)
HDL: 43 mg/dL (ref >40)
LDL Cholesterol (Calc): 167 mg/dL (calc) — ABNORMAL HIGH
Non-HDL Cholesterol (Calc): 185 mg/dL (calc) — ABNORMAL HIGH (ref <130)
Total CHOL/HDL Ratio: 5.3 (calc) — ABNORMAL HIGH (ref <5.0)
Triglycerides: 78 mg/dL (ref <150)

## 2016-12-20 LAB — HEMOGLOBIN A1C
Hgb A1c MFr Bld: 5.6 % of total Hgb (ref <5.7)
Mean Plasma Glucose: 114 (calc)
eAG (mmol/L): 6.3 (calc)

## 2016-12-20 LAB — MICROALBUMIN / CREATININE URINE RATIO
Creatinine, Urine: 259 mg/dL (ref 20–320)
Microalb Creat Ratio: 32 mcg/mg creat — ABNORMAL HIGH (ref <30)
Microalb, Ur: 8.3 mg/dL

## 2017-01-03 ENCOUNTER — Encounter: Payer: Self-pay | Admitting: Family Medicine

## 2017-01-03 DIAGNOSIS — R7303 Prediabetes: Secondary | ICD-10-CM | POA: Insufficient documentation

## 2017-01-03 DIAGNOSIS — E785 Hyperlipidemia, unspecified: Secondary | ICD-10-CM

## 2017-01-03 DIAGNOSIS — E1169 Type 2 diabetes mellitus with other specified complication: Secondary | ICD-10-CM | POA: Insufficient documentation

## 2017-01-03 HISTORY — DX: Hyperlipidemia, unspecified: E78.5

## 2017-01-19 ENCOUNTER — Ambulatory Visit (INDEPENDENT_AMBULATORY_CARE_PROVIDER_SITE_OTHER): Payer: Medicare HMO | Admitting: Family Medicine

## 2017-01-19 ENCOUNTER — Encounter: Payer: Self-pay | Admitting: Family Medicine

## 2017-01-19 ENCOUNTER — Other Ambulatory Visit: Payer: Self-pay

## 2017-01-19 VITALS — BP 138/84 | HR 72 | Temp 97.8°F | Resp 18 | Ht 71.0 in | Wt 178.0 lb

## 2017-01-19 DIAGNOSIS — Z720 Tobacco use: Secondary | ICD-10-CM | POA: Diagnosis not present

## 2017-01-19 DIAGNOSIS — I10 Essential (primary) hypertension: Secondary | ICD-10-CM | POA: Diagnosis not present

## 2017-01-19 MED ORDER — SILDENAFIL CITRATE 20 MG PO TABS: ORAL_TABLET | ORAL | 11 refills | Status: DC

## 2017-01-19 NOTE — Progress Notes (Signed)
    Chief Complaint  Patient presents with  . Follow-up    1 month   Here for followup Feels well Still smoking, does not feel like he will ever quit He is active and walks daily BP is controlled Labs reviewed He used to take metformin for DM, but his A1c off of medicine is 5.6.  I advised him that he still needs to limit sweets and carbohydrates He requests viagra.  Never had heart disease.  He does have CVD risk factors of smoking, HTN and hyperlipidemia, prior DM.  He is given a Rx for generic sildenafil 20 mg to try.    Patient Active Problem List   Diagnosis Date Noted  . Prediabetes 01/03/2017  . HLD (hyperlipidemia) 01/03/2017  . Tobacco abuse 12/19/2016  . Essential hypertension 12/19/2016  . Elevated blood sugar 12/19/2016  . History of prostate cancer 12/19/2016  . Alcoholism in remission ([place]) 12/19/2016    Outpatient Encounter Medications as of 01/19/2017  Medication Sig  . lisinopril (PRINIVIL,ZESTRIL) 5 MG tablet Take 5 mg daily by mouth.  . sildenafil (REVATIO) 20 MG tablet Take 2-3 pills one hour before activity   No facility-administered encounter medications on file as of 01/19/2017.     No Known Allergies  Review of Systems  Constitutional: Negative for activity change, appetite change and unexpected weight change.  HENT: Negative for congestion and dental problem.   Eyes: Negative for photophobia.  Respiratory: Negative for choking and shortness of breath.   Cardiovascular: Negative for chest pain and palpitations.  Gastrointestinal: Negative for blood in stool, constipation and diarrhea.  Genitourinary: Negative for decreased urine volume, frequency and hematuria.  Musculoskeletal: Negative for arthralgias and back pain.  Neurological: Negative for dizziness and headaches.  Psychiatric/Behavioral: Negative for decreased concentration. The patient is not nervous/anxious.     BP 138/84   Pulse 72   Temp 97.8 F (36.6 C) (Temporal)   Resp 18    Ht 5\' 11"  (1.803 m)   Wt 178 lb 0.6 oz (80.8 kg)   SpO2 98%   BMI 24.83 kg/m   Physical Exam  Constitutional: He is oriented to person, place, and time. He appears well-developed and well-nourished.  HENT:  Head: Normocephalic and atraumatic.  Mouth/Throat: Oropharynx is clear and moist.  dentures  Eyes: Conjunctivae are normal. Pupils are equal, round, and reactive to light.  Neck: Normal range of motion. Neck supple.  Cardiovascular: Normal rate, regular rhythm and normal heart sounds.  Pulmonary/Chest: Effort normal and breath sounds normal. No respiratory distress.  Musculoskeletal: Normal range of motion. He exhibits no edema.  Lymphadenopathy:    He has no cervical adenopathy.  Neurological: He is alert and oriented to person, place, and time.  Gait normal  Skin: Skin is warm and dry.  Early clubbing  Psychiatric: He has a normal mood and affect. His behavior is normal. Thought content normal.  Nursing note and vitals reviewed.   ASSESSMENT/PLAN:  1. Essential hypertension  2. Tobacco abuse  3. ED  Patient Instructions  The blood pressure is good Try to walk every day Try to cut down or quit smoking  See me in 6 months    Raylene Everts, MD

## 2017-01-19 NOTE — Patient Instructions (Signed)
The blood pressure is good Try to walk every day Try to cut down or quit smoking  See me in 6 months

## 2017-02-19 ENCOUNTER — Ambulatory Visit: Payer: Medicare HMO

## 2017-05-14 ENCOUNTER — Encounter: Payer: Self-pay | Admitting: Family Medicine

## 2017-07-16 ENCOUNTER — Encounter (HOSPITAL_COMMUNITY): Payer: Self-pay | Admitting: *Deleted

## 2017-07-16 ENCOUNTER — Emergency Department (HOSPITAL_COMMUNITY)
Admission: EM | Admit: 2017-07-16 | Discharge: 2017-07-16 | Disposition: A | Discharge status: Home / Self Care | Payer: Medicare HMO | Attending: Emergency Medicine | Admitting: Emergency Medicine

## 2017-07-16 ENCOUNTER — Other Ambulatory Visit: Payer: Self-pay

## 2017-07-16 DIAGNOSIS — L02811 Cutaneous abscess of head [any part, except face]: Secondary | ICD-10-CM

## 2017-07-16 DIAGNOSIS — F17210 Nicotine dependence, cigarettes, uncomplicated: Secondary | ICD-10-CM | POA: Diagnosis not present

## 2017-07-16 DIAGNOSIS — E119 Type 2 diabetes mellitus without complications: Secondary | ICD-10-CM | POA: Diagnosis not present

## 2017-07-16 DIAGNOSIS — Z79899 Other long term (current) drug therapy: Secondary | ICD-10-CM | POA: Insufficient documentation

## 2017-07-16 DIAGNOSIS — I10 Essential (primary) hypertension: Secondary | ICD-10-CM | POA: Insufficient documentation

## 2017-07-16 MED ORDER — LIDOCAINE-EPINEPHRINE (PF) 2 %-1:200000 IJ SOLN
10.0000 mL | Freq: Once | INTRAMUSCULAR | Status: DC
  Filled 2017-07-16: qty 20

## 2017-07-16 MED ORDER — SULFAMETHOXAZOLE-TRIMETHOPRIM 800-160 MG PO TABS: 1.0000 | ORAL_TABLET | Freq: Two times a day (BID) | ORAL | 0 refills | Status: AC

## 2017-07-16 NOTE — ED Provider Notes (Signed)
[facility] EMERGENCY DEPARTMENT Provider Note   CSN: [id number] Arrival date & time: 07/16/17  6948     History   Chief Complaint Chief Complaint  Patient presents with  . Abscess    HPI Jerry West is a 83 y.o. male.  The history is provided by the patient.  Abscess  He complains of a painful bump on the right side of his head over the last 2-3 days.  It does not seem to be getting worse.  He denies red pain at 10/10.  Is worse if something touches it.  There is been no drainage.  Denies any trauma to the area.  He denies fever, chills, sweats.  Past Medical History:  Diagnosis Date  . Cancer [facility])    prostate  . Cataract   . Diabetes mellitus   . HLD (hyperlipidemia) 01/03/2017  . Hypertension     Patient Active Problem List   Diagnosis Date Noted  . Prediabetes 01/03/2017  . HLD (hyperlipidemia) 01/03/2017  . Tobacco abuse 12/19/2016  . Essential hypertension 12/19/2016  . Elevated blood sugar 12/19/2016  . History of prostate cancer 12/19/2016  . Alcoholism in remission ([place]) 12/19/2016    Past Surgical History:  Procedure Laterality Date  . APPENDECTOMY    . CHOLECYSTECTOMY    . FRACTURE SURGERY          Home Medications    Prior to Admission medications   Medication Sig Start Date End Date Taking? Authorizing Provider  lisinopril (PRINIVIL,ZESTRIL) 5 MG tablet Take 5 mg daily by mouth. 11/16/16   [provider]  sildenafil (REVATIO) 20 MG tablet Take 2-3 pills one hour before activity 01/19/17   Raylene Everts, MD    Family History Family History  Problem Relation Age of Onset  . Stroke Mother   . Alzheimer's disease Father   . Alcohol abuse Father   . Arthritis Father     Social History Social History   Tobacco Use  . Smoking status: Current Every Day Smoker    Packs/day: 0.75    Types: Cigarettes    Start date: [birth year]/06/1946  . Smokeless tobacco: Never Used  Substance Use Topics  . Alcohol use: No   Comment: prior alcoholic  . Drug use: No     Allergies   Patient has no known allergies.   Review of Systems Review of Systems  All other systems reviewed and are negative.    Physical Exam Updated Vital Signs BP (!) 182/86 (BP Location: Left Arm)   Pulse 72   Temp 98.3 F (36.8 C) (Oral)   Resp 20   Ht 5' 10.5" (1.791 m)   Wt 83.9 kg (185 lb)   SpO2 100%   BMI 26.17 kg/m   Physical Exam  Nursing note and vitals reviewed.  83 year old male, resting comfortably and in no acute distress. Vital signs are significant for elevated systolic blood pressure. Oxygen saturation is 100%, which is normal. Head is normocephalic and atraumatic.  Tender, raised area present in the right parietal area.  This measures 1 cm in diameter.  There is a central area of umbilication, slight fluctuance, no active drainage PERRLA, EOMI. Oropharynx is clear. Neck is nontender and supple without adenopathy or JVD. Back is nontender and there is no CVA tenderness. Lungs are clear without rales, wheezes, or rhonchi. Chest is nontender. Heart has regular rate and rhythm without murmur. Abdomen is soft, flat, nontender without masses or hepatosplenomegaly and peristalsis is normoactive. Extremities have  no cyanosis or edema, full range of motion is present. Skin is warm and dry without rash. Neurologic: Mental status is normal, cranial nerves are intact, there are no motor or sensory deficits.  ED Treatments / Results   Procedures [place] bedside Date/Time: 07/16/2017 2:56 AM Performed by: Delora Fuel, MD Authorized by: Delora Fuel, MD  Consent: Verbal consent obtained. Written consent not obtained. Risks and benefits: risks, benefits and alternatives were discussed Consent given by: patient Patient understanding: patient states understanding of the procedure being performed Patient consent: the patient's understanding of the procedure matches consent given Procedure consent: procedure consent  matches procedure scheduled Relevant documents: relevant documents present and verified Site marked: the operative site was marked Required items: required blood products, implants, devices, and special equipment available Patient identity confirmed: verbally with patient and arm band Time out: Immediately prior to procedure a "time out" was called to verify the correct patient, procedure, equipment, support staff and site/side marked as required. Local anesthesia used: no  Anesthesia: Local anesthesia used: no  Sedation: Patient sedated: no  Patient tolerance: Patient tolerated the procedure well with no immediate complications Comments: Limited bedside ultrasound showing small abscess of the scalp in the right parietal area.  Marland Kitchen.Incision and Drainage Date/Time: 07/16/2017 3:41 AM Performed by: Delora Fuel, MD Authorized by: Delora Fuel, MD   Consent:    Consent obtained:  Verbal   Consent given by:  Patient   Risks discussed:  Bleeding, incomplete drainage and pain   Alternatives discussed:  No treatment and alternative treatment Location:    Type:  Abscess   Size:  1 cm   Location:  Head   Head location:  Scalp Pre-procedure details:    Skin preparation:  Antiseptic wash Anesthesia (see MAR for exact dosages):    Anesthesia method:  Local infiltration   Local anesthetic:  Lidocaine 2% WITH epi Procedure type:    Complexity:  Complex Procedure details:    Needle aspiration: no     Incision types:  Cruciate   Incision depth:  Subcutaneous   Scalpel blade:  11   Wound management:  Probed and deloculated   Drainage:  Purulent   Drainage amount:  Scant   Wound treatment:  Wound left open   Packing materials:  None Post-procedure details:    Patient tolerance of procedure:  Tolerated well, no immediate complications    Medications Ordered in ED Medications  lidocaine-EPINEPHrine (XYLOCAINE W/EPI) 2 %-1:200000 (PF) injection 10 mL (has no administration in time  range)     Initial Impression / Assessment and Plan / ED Course  I have reviewed the triage vital signs and the nursing notes.  Abscess in the right parietal area of the scalp, treated with incision and drainage.  Final Clinical Impressions(s) / ED Diagnoses   Final diagnoses:  Scalp abscess    ED Discharge Orders        Ordered    sulfamethoxazole-trimethoprim (BACTRIM DS,SEPTRA DS) 800-160 MG tablet  2 times daily     46/96/29 5284       Delora Fuel, MD 13/24/40 [phone]

## 2017-07-16 NOTE — ED Triage Notes (Signed)
Pt c/o bump to right side of head x 2 days; pt states it has been bleeding some and is very painful

## 2017-07-19 ENCOUNTER — Ambulatory Visit: Payer: Medicare HMO | Admitting: Family Medicine

## 2018-03-30 ENCOUNTER — Other Ambulatory Visit: Payer: Self-pay

## 2018-03-30 ENCOUNTER — Encounter (HOSPITAL_COMMUNITY): Payer: Self-pay | Admitting: Emergency Medicine

## 2018-03-30 ENCOUNTER — Emergency Department (HOSPITAL_COMMUNITY)
Admission: EM | Admit: 2018-03-30 | Discharge: 2018-03-30 | Disposition: A | Discharge status: Home / Self Care | Payer: Medicare HMO | Attending: Emergency Medicine | Admitting: Emergency Medicine

## 2018-03-30 DIAGNOSIS — Z79899 Other long term (current) drug therapy: Secondary | ICD-10-CM | POA: Diagnosis not present

## 2018-03-30 DIAGNOSIS — R600 Localized edema: Secondary | ICD-10-CM | POA: Diagnosis present

## 2018-03-30 DIAGNOSIS — E119 Type 2 diabetes mellitus without complications: Secondary | ICD-10-CM | POA: Insufficient documentation

## 2018-03-30 DIAGNOSIS — L0201 Cutaneous abscess of face: Secondary | ICD-10-CM | POA: Insufficient documentation

## 2018-03-30 DIAGNOSIS — I10 Essential (primary) hypertension: Secondary | ICD-10-CM | POA: Diagnosis not present

## 2018-03-30 DIAGNOSIS — F17210 Nicotine dependence, cigarettes, uncomplicated: Secondary | ICD-10-CM | POA: Diagnosis not present

## 2018-03-30 MED ORDER — SULFAMETHOXAZOLE-TRIMETHOPRIM 800-160 MG PO TABS: 1.0000 | ORAL_TABLET | Freq: Two times a day (BID) | ORAL | 0 refills | Status: AC

## 2018-03-30 NOTE — ED Triage Notes (Signed)
Pt reports recurrent abscess to R lower jaw line, denies dental caries, broken teeth, pain, or difficulty chewing. States the abscess has been there for several days and pt has been squeezing it. Purulent drainage noted.
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
## 2018-03-30 NOTE — Discharge Instructions (Addendum)
I have provided antibiotics to treat your infection, please take 1 tablet twice a day for the next 7 days. If you experience any fever, worsening symptoms please return to the ED.

## 2018-03-30 NOTE — ED Notes (Signed)
Patient unable to sign due to computer system not pulling up signature pad for patient to sign.

## 2018-11-27 DIAGNOSIS — M722 Plantar fascial fibromatosis: Secondary | ICD-10-CM | POA: Diagnosis not present

## 2019-05-21 ENCOUNTER — Encounter: Payer: Self-pay | Admitting: Family Medicine

## 2019-05-21 ENCOUNTER — Other Ambulatory Visit: Payer: Self-pay

## 2019-05-21 ENCOUNTER — Ambulatory Visit (INDEPENDENT_AMBULATORY_CARE_PROVIDER_SITE_OTHER): Payer: Medicare HMO | Admitting: Family Medicine

## 2019-05-21 VITALS — BP 180/98 | HR 78 | Temp 97.1°F | Resp 16 | Ht 70.0 in | Wt 181.8 lb

## 2019-05-21 DIAGNOSIS — E785 Hyperlipidemia, unspecified: Secondary | ICD-10-CM | POA: Diagnosis not present

## 2019-05-21 DIAGNOSIS — R7303 Prediabetes: Secondary | ICD-10-CM

## 2019-05-21 DIAGNOSIS — I10 Essential (primary) hypertension: Secondary | ICD-10-CM

## 2019-05-21 DIAGNOSIS — F17219 Nicotine dependence, cigarettes, with unspecified nicotine-induced disorders: Secondary | ICD-10-CM | POA: Diagnosis not present

## 2019-05-21 DIAGNOSIS — E663 Overweight: Secondary | ICD-10-CM | POA: Insufficient documentation

## 2019-05-21 NOTE — Assessment & Plan Note (Signed)
Getting updated labs  Encouraged a low fat heart healthy diet

## 2019-05-21 NOTE — Assessment & Plan Note (Signed)
Jerry West is encouraged to maintain a well balanced diet that is low in salt. Not controlled, will get labs and start new medication, and close follow up  Additionally, he is also reminded that exercise is beneficial for heart health and control of  Blood pressure. 30-60 minutes daily is recommended-walking was suggested.

## 2019-05-21 NOTE — Progress Notes (Signed)
Subjective:  Patient ID: Jerry West, male    DOB: 06/15/1934  Age: 85 y.o. MRN: [id number]  CC:  Chief Complaint  Patient presents with  . Establish Care    bp is elevated- not currently on any medication       HPI  HPI  Jerry West is an 85 year old male patient who presents today to establish care.  Has not been seen in a doctor's office in multiple years.  He is accompanied by his " baby girl" youngest daughter.  His history includes prostate cancer, cataracts, diabetes type 2 but denies being diabetic at this time reports that his labs were good last time it was checked, hyperlipidemia, hypertension.  He is not currently taking any medications at this time though it looks like he was on lisinopril at some point in time.  Blood pressure is elevated today he is open to being put on something for this if need be.  He also is open to getting updated labs soon.  He is a current everyday smoker.  Does not want to quit at this time.  Would like to quit maybe in the near future but not now.  Denies having any alcohol use has been sober for over 20 years now.  No drug use.  Lives with wife Enid Derry who is a patient of Dr. Griffin Dakin.  Does not have any food restrictions.  Overall reports that he is very active enjoys being outside.  Does not like to do vaccines.  Reports that he had pneumonia vaccine but is unsure when it was.  Today patient denies signs and symptoms of COVID 19 infection including fever, chills, cough, shortness of breath, and headache. Past Medical, Surgical, Social History, Allergies, and Medications have been Reviewed.   Past Medical History:  Diagnosis Date  . Alcoholism in remission ([place]) 12/19/2016  . Cancer [facility])    prostate  . Cataract   . Diabetes mellitus    Type II, denies being a diabetic at this time  . Elevated blood sugar 12/19/2016  . History of prostate cancer 12/19/2016  . HLD (hyperlipidemia) 01/03/2017  . Hypertension   . Tobacco  abuse 12/19/2016    Current Meds  Medication Sig  . [DISCONTINUED] lisinopril (PRINIVIL,ZESTRIL) 5 MG tablet Take 5 mg daily by mouth.    ROS:  Review of Systems  HENT: Negative.   Eyes: Negative.   Respiratory: Negative.   Cardiovascular: Negative.   Gastrointestinal: Negative.   Genitourinary: Negative.   Musculoskeletal: Negative.   Skin: Negative.   Neurological: Negative.   Endo/Heme/Allergies: Negative.   Psychiatric/Behavioral: Negative.   All other systems reviewed and are negative.    Objective:   Today's Vitals: BP (!) 180/98   Pulse 78   Temp (!) 97.1 F (36.2 C) (Temporal)   Resp 16   Ht 5\' 10"  (1.778 m)   Wt 181 lb 12.8 oz (82.5 kg)   SpO2 97%   BMI 26.09 kg/m  Vitals with BMI 05/21/2019 05/21/2019 03/30/2018  Height - 5\' 10"  -  Weight - 181 lbs 13 oz -  BMI - Q000111Q -  Systolic 99991111 99991111 -  Diastolic 98 123XX123 -  Pulse - 78 71     Physical Exam Vitals and nursing note reviewed.  Constitutional:      Appearance: Normal appearance. He is well-developed and well-groomed. He is obese.  HENT:     Head: Normocephalic and atraumatic.     Right Ear: External ear normal.  Left Ear: External ear normal.     Mouth/Throat:     Comments: Mask In Place  Eyes:     General:        Right eye: No discharge.        Left eye: No discharge.     Conjunctiva/sclera: Conjunctivae normal.  Cardiovascular:     Rate and Rhythm: Normal rate and regular rhythm.     Pulses: Normal pulses.     Heart sounds: Normal heart sounds.  Pulmonary:     Effort: Pulmonary effort is normal.     Breath sounds: Normal breath sounds.  Musculoskeletal:        General: Normal range of motion.     Cervical back: Normal range of motion and neck supple.  Skin:    General: Skin is warm.  Neurological:     General: No focal deficit present.     Mental Status: He is alert and oriented to person, place, and time.  Psychiatric:        Attention and Perception: Attention normal.         Mood and Affect: Mood normal.        Speech: Speech normal.        Behavior: Behavior normal. Behavior is cooperative.        Thought Content: Thought content normal.        Cognition and Memory: Cognition normal.        Judgment: Judgment normal.      Assessment   1. Essential hypertension   2. Overweight (BMI 25.0-29.9)   3. Cigarette nicotine dependence with nicotine-induced disorder   4. Hyperlipidemia, unspecified hyperlipidemia type   5. Prediabetes     Tests ordered Orders Placed This Encounter  Procedures  . CBC  . COMPLETE METABOLIC PANEL WITH GFR  . Lipid panel  . Hemoglobin A1c      Plan: Please see assessment and plan per problem list above.   No orders of the defined types were placed in this encounter.  35 mins of face to face time     Patient to follow-up in 5-6 weeks.  Perlie Mayo, NP

## 2019-05-21 NOTE — Assessment & Plan Note (Signed)
  Jerry West is educated about the importance of exercise daily to help with weight management. A minumum of 30 minutes daily is recommended. Additionally, importance of healthy food choices  with portion control discussed.   Wt Readings from Last 3 Encounters:  05/21/19 181 lb 12.8 oz (82.5 kg)  03/30/18 180 lb (81.6 kg)  07/16/17 185 lb (83.9 kg)

## 2019-05-21 NOTE — Patient Instructions (Signed)
I appreciate the opportunity to provide you with care for your health and wellness. Today we discussed: establish care  Follow up: 5-6 weeks   BP follow up   Labs tomorrow fasting -before coffee :)  Based on lab findings we will start BP medication.   Please continue to practice social distancing to keep you, your family, and our community safe.  If you must go out, please wear a mask and practice good handwashing.  It was a pleasure to see you and I look forward to continuing to work together on your health and well-being. Please do not hesitate to call the office if you need care or have questions about your care.  Have a wonderful day and week. With Gratitude, Cherly Beach, DNP, AGNP-BC

## 2019-05-21 NOTE — Assessment & Plan Note (Signed)
Getting updated labs ° °

## 2019-05-22 DIAGNOSIS — R739 Hyperglycemia, unspecified: Secondary | ICD-10-CM | POA: Diagnosis not present

## 2019-05-22 DIAGNOSIS — I10 Essential (primary) hypertension: Secondary | ICD-10-CM | POA: Diagnosis not present

## 2019-05-23 ENCOUNTER — Other Ambulatory Visit: Payer: Self-pay | Admitting: Family Medicine

## 2019-05-23 DIAGNOSIS — E119 Type 2 diabetes mellitus without complications: Secondary | ICD-10-CM

## 2019-05-23 DIAGNOSIS — E785 Hyperlipidemia, unspecified: Secondary | ICD-10-CM

## 2019-05-23 LAB — CBC
HCT: 48.1 % (ref 38.5–50.0)
Hemoglobin: 16.1 g/dL (ref 13.2–17.1)
MCH: 30.2 pg (ref 27.0–33.0)
MCHC: 33.5 g/dL (ref 32.0–36.0)
MCV: 90.2 fL (ref 80.0–100.0)
MPV: 10.8 fL (ref 7.5–12.5)
Platelets: 210 10*3/uL (ref 140–400)
RBC: 5.33 10*6/uL (ref 4.20–5.80)
RDW: 12.8 % (ref 11.0–15.0)
WBC: 3.7 10*3/uL — ABNORMAL LOW (ref 3.8–10.8)

## 2019-05-23 LAB — COMPLETE METABOLIC PANEL WITH GFR
AG Ratio: 1.4 (calc) (ref 1.0–2.5)
ALT: 5 U/L — ABNORMAL LOW (ref 9–46)
AST: 10 U/L (ref 10–35)
Albumin: 4.1 g/dL (ref 3.6–5.1)
Alkaline phosphatase (APISO): 76 U/L (ref 35–144)
BUN/Creatinine Ratio: 10 (calc) (ref 6–22)
BUN: 12 mg/dL (ref 7–25)
CO2: 36 mmol/L — ABNORMAL HIGH (ref 20–32)
Calcium: 9.8 mg/dL (ref 8.6–10.3)
Chloride: 101 mmol/L (ref 98–110)
Creat: 1.18 mg/dL — ABNORMAL HIGH (ref 0.70–1.11)
GFR, Est African American: 66 mL/min/{1.73_m2} (ref >=60)
GFR, Est Non African American: 57 mL/min/{1.73_m2} — ABNORMAL LOW (ref >=60)
Globulin: 2.9 g/dL (calc) (ref 1.9–3.7)
Glucose, Bld: 138 mg/dL — ABNORMAL HIGH (ref 65–99)
Potassium: 4.3 mmol/L (ref 3.5–5.3)
Sodium: 140 mmol/L (ref 135–146)
Total Bilirubin: 1 mg/dL (ref 0.2–1.2)
Total Protein: 7 g/dL (ref 6.1–8.1)

## 2019-05-23 LAB — LIPID PANEL
Cholesterol: 210 mg/dL — ABNORMAL HIGH (ref <200)
HDL: 33 mg/dL — ABNORMAL LOW (ref >=40)
LDL Cholesterol (Calc): 147 mg/dL (calc) — ABNORMAL HIGH
Non-HDL Cholesterol (Calc): 177 mg/dL (calc) — ABNORMAL HIGH (ref <130)
Total CHOL/HDL Ratio: 6.4 (calc) — ABNORMAL HIGH (ref <5.0)
Triglycerides: 162 mg/dL — ABNORMAL HIGH (ref <150)

## 2019-05-23 LAB — HEMOGLOBIN A1C
Hgb A1c MFr Bld: 8.1 % of total Hgb — ABNORMAL HIGH (ref <5.7)
Mean Plasma Glucose: 186 (calc)
eAG (mmol/L): 10.3 (calc)

## 2019-05-23 MED ORDER — ROSUVASTATIN CALCIUM 10 MG PO TABS
10.0000 mg | ORAL_TABLET | Freq: Every day | ORAL | 1 refills | Status: DC
Start: 2019-05-23 — End: 2019-07-21

## 2019-05-23 MED ORDER — METFORMIN HCL 500 MG PO TABS: 500.0000 mg | ORAL_TABLET | Freq: Every day | ORAL | 1 refills | Status: DC

## 2019-06-23 ENCOUNTER — Other Ambulatory Visit: Payer: Self-pay | Admitting: *Deleted

## 2019-06-23 DIAGNOSIS — E119 Type 2 diabetes mellitus without complications: Secondary | ICD-10-CM

## 2019-06-23 MED ORDER — METFORMIN HCL 500 MG PO TABS: 500.0000 mg | ORAL_TABLET | Freq: Every day | ORAL | 1 refills | Status: DC

## 2019-06-25 ENCOUNTER — Encounter: Payer: Self-pay | Admitting: Family Medicine

## 2019-06-25 ENCOUNTER — Ambulatory Visit (INDEPENDENT_AMBULATORY_CARE_PROVIDER_SITE_OTHER): Payer: Medicare HMO | Admitting: Family Medicine

## 2019-06-25 ENCOUNTER — Other Ambulatory Visit: Payer: Self-pay

## 2019-06-25 VITALS — BP 148/80 | Temp 97.3°F | Resp 16 | Ht 70.0 in | Wt 177.1 lb

## 2019-06-25 DIAGNOSIS — I10 Essential (primary) hypertension: Secondary | ICD-10-CM | POA: Diagnosis not present

## 2019-06-25 NOTE — Progress Notes (Signed)
Subjective:  Patient ID: Jerry West, male    DOB: 11-15-1934  Age: 85 y.o. MRN: [id number]  CC:  Chief Complaint  Patient presents with  . Hypertension    follow up visit -BP recheck       HPI  HPI Jerry West is an 85 year old male patient of mine who recently established in March 2021.  He is here today for follow-up on high blood pressure.  He reports that he is not taking anything for blood pressure at this time.  He reports that he eats what he would like to eat does not follow any specific diet.  Is not currently actively in any structured exercises but is very engaged and outgoing with working in the yard.  Does not check blood pressure at home.  Denies having any chest pain, shortness of breath, leg swelling, headaches, vision changes, dizziness or any other signs and symptoms of uncontrolled blood pressure at this time.  Blood pressure is much more controlled today in office.  Reports he has been taking his Crestor and his Metformin which we started him on for diabetes and elevated cholesterol on his first set of labs that we drew.  He denies having any side effects.  Just under chart thanks you to okay okay have a good day tomorrow   Today patient denies signs and symptoms of COVID 19 infection including fever, chills, cough, shortness of breath, and headache. Past Medical, Surgical, Social History, Allergies, and Medications have been Reviewed.   Past Medical History:  Diagnosis Date  . Alcoholism in remission ([place]) 12/19/2016  . Cancer [facility])    prostate  . Cataract   . Diabetes mellitus    Type II, denies being a diabetic at this time  . Elevated blood sugar 12/19/2016  . History of prostate cancer 12/19/2016  . HLD (hyperlipidemia) 01/03/2017  . Hypertension   . Tobacco abuse 12/19/2016    Current Meds  Medication Sig  . metFORMIN (GLUCOPHAGE) 500 MG tablet Take 1 tablet (500 mg total) by mouth daily with supper. Take with meal.  . rosuvastatin  (CRESTOR) 10 MG tablet Take 1 tablet (10 mg total) by mouth daily.    ROS:  Review of Systems  Constitutional: Negative.   HENT: Negative.   Eyes: Negative.   Respiratory: Negative.   Cardiovascular: Negative.   Gastrointestinal: Negative.   Genitourinary: Negative.   Musculoskeletal: Negative.   Skin: Negative.   Neurological: Negative.   Endo/Heme/Allergies: Negative.   Psychiatric/Behavioral: Negative.   All other systems reviewed and are negative.    Objective:   Today's Vitals: BP (!) 148/80   Temp (!) 97.3 F (36.3 C) (Temporal)   Resp 16   Ht 5\' 10"  (1.778 m)   Wt 177 lb 1.9 oz (80.3 kg)   SpO2 97%   BMI 25.41 kg/m  Vitals with BMI 06/25/2019 05/21/2019 05/21/2019  Height 5\' 10"  - 5\' 10"   Weight 177 lbs 2 oz - 181 lbs 13 oz  BMI [id number] - Q000111Q  Systolic [id number] 99991111  Diastolic 80 98 123XX123  Pulse - - 78     Physical Exam Vitals and nursing note reviewed.  Constitutional:      Appearance: Normal appearance. He is well-developed, well-groomed and overweight.  HENT:     Head: Normocephalic and atraumatic.     Right Ear: External ear normal.     Left Ear: External ear normal.     Mouth/Throat:     Comments: Mask in  place  Eyes:     General:        Right eye: No discharge.        Left eye: No discharge.     Conjunctiva/sclera: Conjunctivae normal.  Cardiovascular:     Rate and Rhythm: Normal rate and regular rhythm.     Pulses: Normal pulses.     Heart sounds: Normal heart sounds.  Pulmonary:     Effort: Pulmonary effort is normal.     Breath sounds: Normal breath sounds.  Musculoskeletal:        General: Normal range of motion.     Cervical back: Normal range of motion and neck supple.  Skin:    General: Skin is warm.  Neurological:     General: No focal deficit present.     Mental Status: He is alert and oriented to person, place, and time.  Psychiatric:        Attention and Perception: Attention normal.        Mood and Affect: Mood and affect  normal.        Speech: Speech normal.        Behavior: Behavior normal. Behavior is cooperative.        Thought Content: Thought content normal.        Cognition and Memory: Cognition and memory normal.        Judgment: Judgment normal.     Assessment   1. Essential hypertension     Tests ordered No orders of the defined types were placed in this encounter.    Plan: Please see assessment and plan per problem list above.   No orders of the defined types were placed in this encounter.   Patient to follow-up in 08/27/2019.  Perlie Mayo, NP

## 2019-06-25 NOTE — Assessment & Plan Note (Signed)
Jerry West is encouraged to maintain a well balanced diet that is low in salt. Controlled on higher end. Will waiting on adding another medication at this time. Will revisit at next visit.  Additionally, he is also reminded that exercise is beneficial for heart health and control of Blood pressure. 30-60 minutes daily is recommended-walking was suggested.  Patient acknowledged agreement and understanding of the plan.

## 2019-06-25 NOTE — Patient Instructions (Signed)
I appreciate the opportunity to provide you with care for your health and wellness. Today we discussed: overall health   Follow up: June BP and DM   No labs or referrals today  We will plan for labs that day, so no worry go ahead of time.  Continue all medications  Thank you for the wonderful conversation and sharing of compassion for our World to be a better tomorrow for [place] all. Much gratitude.   Please continue to practice social distancing to keep you, your family, and our community safe.  If you must go out, please wear a mask and practice good handwashing.  It was a pleasure to see you and I look forward to continuing to work together on your health and well-being. Please do not hesitate to call the office if you need care or have questions about your care.  Have a wonderful day and week. With Gratitude, Cherly Beach, DNP, AGNP-BC

## 2019-07-14 ENCOUNTER — Emergency Department (HOSPITAL_COMMUNITY)
Admission: EM | Admit: 2019-07-14 | Discharge: 2019-07-14 | Disposition: A | Discharge status: Home / Self Care | Payer: Medicare HMO | Attending: Emergency Medicine | Admitting: Emergency Medicine

## 2019-07-14 ENCOUNTER — Other Ambulatory Visit: Payer: Self-pay

## 2019-07-14 ENCOUNTER — Encounter (HOSPITAL_COMMUNITY): Payer: Self-pay | Admitting: *Deleted

## 2019-07-14 DIAGNOSIS — F17210 Nicotine dependence, cigarettes, uncomplicated: Secondary | ICD-10-CM | POA: Insufficient documentation

## 2019-07-14 DIAGNOSIS — R7303 Prediabetes: Secondary | ICD-10-CM | POA: Diagnosis not present

## 2019-07-14 DIAGNOSIS — I10 Essential (primary) hypertension: Secondary | ICD-10-CM | POA: Diagnosis not present

## 2019-07-14 DIAGNOSIS — L02811 Cutaneous abscess of head [any part, except face]: Secondary | ICD-10-CM | POA: Insufficient documentation

## 2019-07-14 DIAGNOSIS — Z8546 Personal history of malignant neoplasm of prostate: Secondary | ICD-10-CM | POA: Diagnosis not present

## 2019-07-14 DIAGNOSIS — Z79899 Other long term (current) drug therapy: Secondary | ICD-10-CM | POA: Insufficient documentation

## 2019-07-14 MED ORDER — LIDOCAINE-EPINEPHRINE (PF) 2 %-1:200000 IJ SOLN
10.0000 mL | Freq: Once | INTRAMUSCULAR | Status: AC
  Administered 2019-07-14: 10 mL
  Filled 2019-07-14: qty 10

## 2019-07-14 MED ORDER — CEPHALEXIN 250 MG PO CAPS
250.0000 mg | ORAL_CAPSULE | Freq: Once | ORAL | Status: AC
  Administered 2019-07-14: 250 mg via ORAL
  Filled 2019-07-14 (×2): qty 1

## 2019-07-14 MED ORDER — CEPHALEXIN 250 MG PO CAPS
250.0000 mg | ORAL_CAPSULE | Freq: Four times a day (QID) | ORAL | 0 refills | Status: DC
Start: 2019-07-14 — End: 2019-11-27

## 2019-07-14 NOTE — ED Provider Notes (Signed)
[facility] EMERGENCY DEPARTMENT Provider Note   CSN: [id number] Arrival date & time: 07/14/19  1816     History Chief Complaint  Patient presents with  . Cyst    Jerry West is a 85 y.o. male.  He is complaining of sore on the right side of his head.  He said it started about a week ago was a little pimple which he scratched and now its been getting bigger and more painful.  No fevers.  No prior history of same.  No nausea or vomiting.  The history is provided by the patient.  Abscess Location:  Head/neck Head/neck abscess location:  Scalp Size:  3 Abscess quality: fluctuance and painful   Red streaking: no   Progression:  Worsening Pain details:    Quality:  Throbbing   Severity:  Moderate   Timing:  Constant   Progression:  Worsening Chronicity:  New Context: diabetes   Relieved by:  Nothing Worsened by:  Draining/squeezing Ineffective treatments:  None tried Associated symptoms: no fever, no nausea and no vomiting        Past Medical History:  Diagnosis Date  . Alcoholism in remission ([place]) 12/19/2016  . Cancer [facility])    prostate  . Cataract   . Diabetes mellitus    Type II, denies being a diabetic at this time  . Elevated blood sugar 12/19/2016  . History of prostate cancer 12/19/2016  . HLD (hyperlipidemia) 01/03/2017  . Hypertension   . Tobacco abuse 12/19/2016    Patient Active Problem List   Diagnosis Date Noted  . Overweight (BMI 25.0-29.9) 05/21/2019  . Cigarette nicotine dependence with nicotine-induced disorder 05/21/2019  . Prediabetes 01/03/2017  . HLD (hyperlipidemia) 01/03/2017  . Essential hypertension 12/19/2016    Past Surgical History:  Procedure Laterality Date  . APPENDECTOMY    . CHOLECYSTECTOMY    . FRACTURE SURGERY         Family History  Problem Relation Age of Onset  . Stroke Mother   . Alzheimer's disease Father   . Alcohol abuse Father   . Arthritis Father     Social History   Tobacco Use  .  Smoking status: Current Every Day Smoker    Packs/day: 0.75    Types: Cigarettes    Start date: 03/06/1946  . Smokeless tobacco: Never Used  Substance Use Topics  . Alcohol use: No    Comment: prior alcoholic stopped in 0000000  . Drug use: No    Home Medications Prior to Admission medications   Medication Sig Start Date End Date Taking? Authorizing Provider  metFORMIN (GLUCOPHAGE) 500 MG tablet Take 1 tablet (500 mg total) by mouth daily with supper. Take with meal. 06/23/19  Yes Perlie Mayo, NP  rosuvastatin (CRESTOR) 10 MG tablet Take 1 tablet (10 mg total) by mouth daily. 05/23/19  Yes Perlie Mayo, NP    Allergies    Patient has no known allergies.  Review of Systems   Review of Systems  Constitutional: Negative for fever.  Eyes: Negative for visual disturbance.  Gastrointestinal: Negative for nausea and vomiting.  Skin: Positive for wound.    Physical Exam Updated Vital Signs BP (!) 176/85   Pulse 76   Temp 97.9 F (36.6 C)   Resp 20   Ht 5\' 10"  (1.778 m)   Wt 80.3 kg   SpO2 99%   BMI 25.40 kg/m   Physical Exam Vitals and nursing note reviewed.  Constitutional:      Appearance:  He is well-developed.  HENT:     Head: Normocephalic and atraumatic.     Comments: approx 3 cm tender fluctuant mass right parietal area Eyes:     Conjunctiva/sclera: Conjunctivae normal.  Pulmonary:     Effort: Pulmonary effort is normal.  Musculoskeletal:     Cervical back: Neck supple.  Skin:    General: Skin is warm and dry.  Neurological:     General: No focal deficit present.     Mental Status: He is alert.     GCS: GCS eye subscore is 4. GCS verbal subscore is 5. GCS motor subscore is 6.     Gait: Gait normal.     ED Results / Procedures / Treatments   Labs (all labs ordered are listed, but only abnormal results are displayed) Labs Reviewed - No data to display  EKG None  Radiology No results found.  Procedures .Marland KitchenIncision and Drainage  Date/Time:  07/14/2019 8:53 PM Performed by: Hayden Rasmussen, MD Authorized by: Hayden Rasmussen, MD   Consent:    Consent obtained:  Verbal   Consent given by:  Patient   Risks discussed:  Bleeding, incomplete drainage, pain and infection   Alternatives discussed:  No treatment, delayed treatment and referral Location:    Type:  Cyst   Size:  3   Location:  Head   Head location:  Scalp Pre-procedure details:    Skin preparation:  Betadine Anesthesia (see MAR for exact dosages):    Anesthesia method:  Local infiltration   Local anesthetic:  Lidocaine 2% WITH epi Procedure type:    Complexity:  Complex Procedure details:    Incision types:  Single straight   Incision depth:  Subcutaneous   Scalpel blade:  15   Wound management:  Probed and deloculated   Drainage:  Purulent   Drainage amount:  Scant   Wound treatment:  Wound left open   Packing materials:  None Post-procedure details:    Patient tolerance of procedure:  Tolerated well, no immediate complications   (including critical care time)  Medications Ordered in ED Medications  lidocaine-EPINEPHrine (XYLOCAINE W/EPI) 2 %-1:200000 (PF) injection 10 mL (has no administration in time range)    ED Course  I have reviewed the triage vital signs and the nursing notes.  Pertinent labs & imaging results that were available during my care of the patient were reviewed by me and considered in my medical decision making (see chart for details).    MDM Rules/Calculators/A&P                      85 year old male here with a mass on his scalp.  Appears to be either an abscess or infected sebaceous cyst.  I indeed with some discharge.  Wound culture sent.  Will cover with Keflex.  Return instructions discussed. Final Clinical Impression(s) / ED Diagnoses Final diagnoses:  Scalp abscess    Rx / DC Orders ED Discharge Orders         Ordered    cephALEXin (KEFLEX) 250 MG capsule  4 times daily     07/14/19 2054             Hayden Rasmussen, MD 07/15/19 1057

## 2019-07-14 NOTE — Discharge Instructions (Addendum)
You were seen in the emergency department for an abscess on your scalp.  We cut into the area and left it open.  You can shower and use soap and water.  Please finish your antibiotics.  Follow-up with your doctor and return to the emergency department if any worsening or concerning symptoms

## 2019-07-14 NOTE — ED Triage Notes (Signed)
Lesion on right side of head for over a week, states it got bigger after scratching a pimple

## 2019-07-15 ENCOUNTER — Other Ambulatory Visit: Payer: Self-pay | Admitting: *Deleted

## 2019-07-15 DIAGNOSIS — E119 Type 2 diabetes mellitus without complications: Secondary | ICD-10-CM

## 2019-07-15 MED ORDER — METFORMIN HCL 500 MG PO TABS: 500.0000 mg | ORAL_TABLET | Freq: Every day | ORAL | 5 refills | Status: DC

## 2019-07-17 LAB — AEROBIC CULTURE W GRAM STAIN (SUPERFICIAL SPECIMEN): Gram Stain: NONE SEEN

## 2019-07-18 ENCOUNTER — Telehealth: Payer: Self-pay | Admitting: Emergency Medicine

## 2019-07-18 NOTE — Telephone Encounter (Signed)
Post ED Visit - Positive Culture Follow-up: Successful Patient Follow-Up  Culture assessed and recommendations reviewed by:  []  Elenor Quinones, Pharm.D. []  Heide Guile, Pharm.D., BCPS AQ-ID []  Parks Neptune, Pharm.D., BCPS []  Alycia Rossetti, Pharm.D., BCPS []  [place], Pharm.D., BCPS, AAHIVP []  Legrand Como, Pharm.D., BCPS, AAHIVP [x]  Salome Arnt, PharmD, BCPS []  Johnnette Gourd, PharmD, BCPS []  Hughes Better, PharmD, BCPS []  Leeroy Cha, PharmD  Positive aerobic culture  []  Patient discharged without antimicrobial prescription and treatment is now indicated [x]  Organism is resistant to prescribed ED discharge antimicrobial []  Patient with positive blood cultures  Changes discussed with ED provider: Carlisle Cater PA New antibiotic prescription: Doxycycline 100 mg BID x seven days Called to [facility] [phone]  Contacted patient, date 07/18/2019, time [place] 07/18/2019, 9:43 AM

## 2019-07-18 NOTE — Progress Notes (Signed)
ED Antimicrobial Stewardship Positive Culture Follow Up   Jerry West is an 85 y.o. male who presented to [facility] on 07/14/2019 with a chief complaint of  Chief Complaint  Patient presents with  . Cyst    Recent Results (from the past 720 hour(s))  Wound or Superficial Culture     Status: None   Collection Time: 07/14/19  8:24 PM   Specimen: SCALP; Wound  Result Value Ref Range Status   Specimen Description   Final    SCALP Performed at [facility], [address], [place], [place] [address]    Special Requests   Final    NONE Performed at [facility], [address], [place], [place] [address]    Gram Stain   Final    NO RBC SEEN MODERATE GRAM POSITIVE COCCI IN PAIRS Performed at [facility], [place] [address] [place], [place] [address]    Culture   Final    ABUNDANT METHICILLIN RESISTANT STAPHYLOCOCCUS AUREUS   Report Status 07/17/2019 FINAL  Final   Organism ID, Bacteria METHICILLIN RESISTANT STAPHYLOCOCCUS AUREUS  Final      Susceptibility   Methicillin resistant staphylococcus aureus - MIC*    CIPROFLOXACIN <=0.5 SENSITIVE Sensitive     ERYTHROMYCIN <=0.25 SENSITIVE Sensitive     GENTAMICIN <=0.5 SENSITIVE Sensitive     OXACILLIN >=4 RESISTANT Resistant     TETRACYCLINE <=1 SENSITIVE Sensitive     VANCOMYCIN <=0.5 SENSITIVE Sensitive     TRIMETH/SULFA <=10 SENSITIVE Sensitive     CLINDAMYCIN <=0.25 SENSITIVE Sensitive     RIFAMPIN <=0.5 SENSITIVE Sensitive     Inducible Clindamycin NEGATIVE Sensitive     * ABUNDANT METHICILLIN RESISTANT STAPHYLOCOCCUS AUREUS    [x]  Treated with cephalexin, organism resistant to prescribed antimicrobial []  Patient discharged originally without antimicrobial agent and treatment is now indicated  New antibiotic prescription: DC cephalexin, start doxycycline 100mg  PO BID x 7 days  ED Provider: Carlisle Cater, PA-C   Temima Kutsch, Rande Lawman 07/18/2019, 9:07 AM Clinical Pharmacist Monday - Friday phone -   [phone] Saturday - Sunday phone - [phone]

## 2019-07-21 ENCOUNTER — Other Ambulatory Visit: Payer: Self-pay | Admitting: *Deleted

## 2019-07-21 DIAGNOSIS — E785 Hyperlipidemia, unspecified: Secondary | ICD-10-CM

## 2019-07-21 MED ORDER — ROSUVASTATIN CALCIUM 10 MG PO TABS: 10.0000 mg | ORAL_TABLET | Freq: Every day | ORAL | 1 refills | Status: DC

## 2019-08-22 ENCOUNTER — Other Ambulatory Visit: Payer: Self-pay

## 2019-08-22 ENCOUNTER — Emergency Department (HOSPITAL_COMMUNITY): Payer: Medicare HMO

## 2019-08-22 ENCOUNTER — Encounter (HOSPITAL_COMMUNITY): Payer: Self-pay | Admitting: *Deleted

## 2019-08-22 ENCOUNTER — Emergency Department (HOSPITAL_COMMUNITY)
Admission: EM | Admit: 2019-08-22 | Discharge: 2019-08-22 | Disposition: A | Discharge status: Home / Self Care | Payer: Medicare HMO | Attending: Emergency Medicine | Admitting: Emergency Medicine

## 2019-08-22 DIAGNOSIS — Y9389 Activity, other specified: Secondary | ICD-10-CM | POA: Insufficient documentation

## 2019-08-22 DIAGNOSIS — X58XXXA Exposure to other specified factors, initial encounter: Secondary | ICD-10-CM | POA: Diagnosis not present

## 2019-08-22 DIAGNOSIS — Z7984 Long term (current) use of oral hypoglycemic drugs: Secondary | ICD-10-CM | POA: Insufficient documentation

## 2019-08-22 DIAGNOSIS — I10 Essential (primary) hypertension: Secondary | ICD-10-CM | POA: Insufficient documentation

## 2019-08-22 DIAGNOSIS — S39012A Strain of muscle, fascia and tendon of lower back, initial encounter: Secondary | ICD-10-CM | POA: Insufficient documentation

## 2019-08-22 DIAGNOSIS — F17210 Nicotine dependence, cigarettes, uncomplicated: Secondary | ICD-10-CM | POA: Diagnosis not present

## 2019-08-22 DIAGNOSIS — Y998 Other external cause status: Secondary | ICD-10-CM | POA: Insufficient documentation

## 2019-08-22 DIAGNOSIS — E119 Type 2 diabetes mellitus without complications: Secondary | ICD-10-CM | POA: Diagnosis not present

## 2019-08-22 DIAGNOSIS — Y9289 Other specified places as the place of occurrence of the external cause: Secondary | ICD-10-CM | POA: Diagnosis not present

## 2019-08-22 DIAGNOSIS — N281 Cyst of kidney, acquired: Secondary | ICD-10-CM | POA: Diagnosis not present

## 2019-08-22 LAB — BASIC METABOLIC PANEL
Anion gap: 9 (ref 5–15)
BUN: 12 mg/dL (ref 8–23)
CO2: 31 mmol/L (ref 22–32)
Calcium: 9.1 mg/dL (ref 8.9–10.3)
Chloride: 98 mmol/L (ref 98–111)
Creatinine, Ser: 1.16 mg/dL (ref 0.61–1.24)
GFR calc Af Amer: >60 mL/min (ref >60)
GFR calc non Af Amer: 58 mL/min — ABNORMAL LOW (ref >60)
Glucose, Bld: 97 mg/dL (ref 70–99)
Potassium: 4.1 mmol/L (ref 3.5–5.1)
Sodium: 138 mmol/L (ref 135–145)

## 2019-08-22 LAB — CBC WITH DIFFERENTIAL/PLATELET
Abs Immature Granulocytes: 0.02 10*3/uL (ref 0.00–0.07)
Basophils Absolute: 0 10*3/uL (ref 0.0–0.1)
Basophils Relative: 1 %
Eosinophils Absolute: 0.1 10*3/uL (ref 0.0–0.5)
Eosinophils Relative: 3 %
HCT: 46.6 % (ref 39.0–52.0)
Hemoglobin: 15.4 g/dL (ref 13.0–17.0)
Immature Granulocytes: 1 %
Lymphocytes Relative: 43 %
Lymphs Abs: 1.7 10*3/uL (ref 0.7–4.0)
MCH: 30.9 pg (ref 26.0–34.0)
MCHC: 33 g/dL (ref 30.0–36.0)
MCV: 93.4 fL (ref 80.0–100.0)
Monocytes Absolute: 0.3 10*3/uL (ref 0.1–1.0)
Monocytes Relative: 8 %
Neutro Abs: 1.8 10*3/uL (ref 1.7–7.7)
Neutrophils Relative %: 44 %
Platelets: 194 10*3/uL (ref 150–400)
RBC: 4.99 MIL/uL (ref 4.22–5.81)
RDW: 13.2 % (ref 11.5–15.5)
WBC: 4 10*3/uL (ref 4.0–10.5)
nRBC: 0 % (ref 0.0–0.2)

## 2019-08-22 LAB — URINALYSIS, ROUTINE W REFLEX MICROSCOPIC
Bacteria, UA: NONE SEEN
Bilirubin Urine: NEGATIVE
Glucose, UA: NEGATIVE mg/dL
Ketones, ur: NEGATIVE mg/dL
Leukocytes,Ua: NEGATIVE
Nitrite: NEGATIVE
Protein, ur: NEGATIVE mg/dL
Specific Gravity, Urine: 1.008 (ref 1.005–1.030)
pH: 6 (ref 5.0–8.0)

## 2019-08-22 MED ORDER — ACETAMINOPHEN 500 MG PO TABS: 500.0000 mg | ORAL_TABLET | Freq: Four times a day (QID) | ORAL | 0 refills | Status: AC | PRN

## 2019-08-22 MED ORDER — ACETAMINOPHEN 325 MG PO TABS
650.0000 mg | ORAL_TABLET | Freq: Once | ORAL | Status: AC
  Administered 2019-08-22: 650 mg via ORAL
  Filled 2019-08-22: qty 2
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
## 2019-08-22 NOTE — ED Provider Notes (Signed)
  Face-to-face evaluation   History: He presents for evaluation of left lower back pain which started about a day and half ago.  No known trauma.  Pain is worse with touch and movement.  Physical exam: Awake alert cooperative.  He has good range of motion lower back.  Left lumbar area is mildly tender.  No tenderness over the lumbar spine.  Medical screening examination/treatment/procedure(s) were conducted as a shared visit with non-physician practitioner(s) and myself.  I personally evaluated the patient during the encounter    Daleen Bo, MD 08/24/19 1036

## 2019-08-22 NOTE — ED Triage Notes (Signed)
C/o back pain onset yesterday, hurts to move

## 2019-08-22 NOTE — Discharge Instructions (Signed)
Fortunately your CT scan does not show any acute finding.  Your labs are okay.  Take Tylenol as needed for pain.  Follow-up closely with your doctor for further care.

## 2019-08-22 NOTE — ED Notes (Signed)
To CT

## 2019-08-27 ENCOUNTER — Other Ambulatory Visit: Payer: Self-pay

## 2019-08-27 ENCOUNTER — Encounter: Payer: Self-pay | Admitting: Family Medicine

## 2019-08-27 ENCOUNTER — Ambulatory Visit (INDEPENDENT_AMBULATORY_CARE_PROVIDER_SITE_OTHER): Payer: Medicare HMO | Admitting: Family Medicine

## 2019-08-27 VITALS — BP 142/84 | HR 75 | Temp 97.3°F | Ht 70.0 in | Wt 172.1 lb

## 2019-08-27 DIAGNOSIS — I10 Essential (primary) hypertension: Secondary | ICD-10-CM | POA: Diagnosis not present

## 2019-08-27 DIAGNOSIS — E119 Type 2 diabetes mellitus without complications: Secondary | ICD-10-CM | POA: Diagnosis not present

## 2019-08-27 DIAGNOSIS — F17219 Nicotine dependence, cigarettes, with unspecified nicotine-induced disorders: Secondary | ICD-10-CM | POA: Diagnosis not present

## 2019-08-27 LAB — POCT GLYCOSYLATED HEMOGLOBIN (HGB A1C)
HbA1c POC (<> result, manual entry): 6.7 % (ref 4.0–5.6)
HbA1c, POC (controlled diabetic range): 6.7 % (ref 0.0–7.0)
HbA1c, POC (prediabetic range): 6.7 % — AB (ref 5.7–6.4)
Hemoglobin A1C: 6.7 % — AB (ref 4.0–5.6)

## 2019-08-27 NOTE — Assessment & Plan Note (Signed)
Asked about quitting: confirms they are currently smokes cigarettes Advise to quit smoking: Educated about QUITTING to reduce the risk of cancer, cardio and cerebrovascular disease. Assess willingness: Unwilling to quit at this time, but is working on cutting back. Assist with counseling and pharmacotherapy: Counseled for 5 minutes and literature provided. Arrange for follow up:  not quitting follow up in 3 months and continue to offer help.   

## 2019-08-27 NOTE — Patient Instructions (Signed)
I appreciate the opportunity to provide you with care for your health and wellness. Today we discussed: DM and BP    Follow up: 3 months in office   No labs or referrals today  A1c is much improved to 6.7%! That is great.  Continue all medications at this time.  BP is still on higher end of controlled level. But we will watch it for now.   HAVE A GREAT SUMMER :)  Please continue to practice social distancing to keep you, your family, and our community safe.  If you must go out, please wear a mask and practice good handwashing.  It was a pleasure to see you and I look forward to continuing to work together on your health and well-being. Please do not hesitate to call the office if you need care or have questions about your care.  Have a wonderful day and week. With Gratitude, Cherly Beach, DNP, AGNP-BC

## 2019-08-27 NOTE — Assessment & Plan Note (Signed)
Jerry West is encouraged to maintain a well balanced diet that is low in salt. Controlled on higher end.  However much improved than when he previously had come into the office.  Additionally, hw is also reminded that exercise is beneficial for heart health and control of  Blood pressure. 30-60 minutes daily is recommended-walking was suggested.

## 2019-08-27 NOTE — Progress Notes (Signed)
Subjective:  Patient ID: Jerry West, male    DOB: 1934-12-05  Age: 85 y.o. MRN: [id number]  CC:  Chief Complaint  Patient presents with  . Follow-up    follow up bp and dm no probs was seen in ER for back pain few days ago this is better now       HPI  HPI   Jerry West is an 85 year old male patient of mine.  He is a very pleasant and jovial man. He presents today for 66-month follow-up. He did go to the emergency room for back pain in May but overall he reports that he is doing better with this. He denies having any sleeping trouble.  He denies having any problems chewing, swallowing, eating.  He denies having any trouble with bowel or bladder habit changes.  Reports he gets up to 3 times a night to void but overall is doing well.  Denies having any blood in his urine or stool.  Denies having any memory changes or mood changes.  Denies having any falls or injuries recently.  Denies having any skin issues or concerns.  Reports he is still smoking about a quarter of a pack or so a day.  Taking his medications as directed and without any issues or concerns.  Denies having any polydipsia, polyuria, polyphagia.  Denies having any chest pain, leg swelling, shortness of breath, cough, palpitations, headaches, dizziness or vision changes.  Today patient denies signs and symptoms of COVID 19 infection including fever, chills, cough, shortness of breath, and headache. Past Medical, Surgical, Social History, Allergies, and Medications have been Reviewed.   Past Medical History:  Diagnosis Date  . Alcoholism in remission ([place]) 12/19/2016  . Cancer [facility])    prostate  . Cataract   . Diabetes mellitus    Type II, denies being a diabetic at this time  . Elevated blood sugar 12/19/2016  . History of prostate cancer 12/19/2016  . HLD (hyperlipidemia) 01/03/2017  . Hypertension   . Tobacco abuse 12/19/2016    Current Meds  Medication Sig  . acetaminophen (TYLENOL) 500 MG  tablet Take 1 tablet (500 mg total) by mouth every 6 (six) hours as needed.  . cephALEXin (KEFLEX) 250 MG capsule Take 1 capsule (250 mg total) by mouth 4 (four) times daily.  . metFORMIN (GLUCOPHAGE) 500 MG tablet Take 1 tablet (500 mg total) by mouth daily with supper. Take with meal.  . rosuvastatin (CRESTOR) 10 MG tablet Take 1 tablet (10 mg total) by mouth daily.    ROS:  Review of Systems  Constitutional: Negative.   HENT: Negative.   Eyes: Negative.   Respiratory: Negative.   Cardiovascular: Negative.   Gastrointestinal: Negative.   Genitourinary: Negative.   Musculoskeletal: Negative.   Skin: Negative.   Neurological: Negative.   Endo/Heme/Allergies: Negative.   Psychiatric/Behavioral: Negative.   All other systems reviewed and are negative.    Objective:   Today's Vitals: BP (!) 152/84 (BP Location: Right Arm, Patient Position: Sitting, Cuff Size: Normal)   Pulse 75   Temp (!) 97.3 F (36.3 C) (Temporal)   Ht 5\' 10"  (1.778 m)   Wt 172 lb 1.9 oz (78.1 kg)   SpO2 98%   BMI 24.70 kg/m  Vitals with BMI 08/27/2019 08/22/2019 08/22/2019  Height 5\' 10"  - 5\' 10"   Weight 172 lbs 2 oz - 178 lbs  BMI 75.9 - 16.38  Systolic 466 599 357  Diastolic 84 84 90  Pulse 75 63  66     Physical Exam Vitals and nursing note reviewed.  Constitutional:      Appearance: Normal appearance. He is well-developed, well-groomed and normal weight.  HENT:     Head: Normocephalic and atraumatic.     Right Ear: External ear normal.     Left Ear: External ear normal.     Mouth/Throat:     Comments: Mask in place Eyes:     General:        Right eye: No discharge.        Left eye: No discharge.     Conjunctiva/sclera: Conjunctivae normal.  Cardiovascular:     Rate and Rhythm: Normal rate and regular rhythm.     Pulses: Normal pulses.     Heart sounds: Normal heart sounds.  Pulmonary:     Effort: Pulmonary effort is normal.     Breath sounds: Normal breath sounds.  Musculoskeletal:         General: Normal range of motion.     Cervical back: Normal range of motion and neck supple.  Skin:    General: Skin is warm.  Neurological:     General: No focal deficit present.     Mental Status: He is alert and oriented to person, place, and time.  Psychiatric:        Attention and Perception: Attention normal.        Mood and Affect: Mood normal.        Speech: Speech normal.        Behavior: Behavior normal. Behavior is cooperative.        Thought Content: Thought content normal.        Cognition and Memory: Cognition normal.        Judgment: Judgment normal.     Comments: Jovial in conversation, good communication, good eye contact      Assessment   1. Type 2 diabetes mellitus without complication, without long-term current use of insulin ([place])   2. Essential hypertension   3. Cigarette nicotine dependence with nicotine-induced disorder     Tests ordered Orders Placed This Encounter  Procedures  . POCT glycosylated hemoglobin (Hb A1C)     Plan: Please see assessment and plan per problem list above.   No orders of the defined types were placed in this encounter.   Patient to follow-up in 3 months  Perlie Mayo, NP

## 2019-08-27 NOTE — Assessment & Plan Note (Signed)
A1c has improved tremendously from 8.1 to 6.1%.  On the Metformin we will continue Metformin at this time.  Tolerating well encouraged heart healthy diabetic friendly diet.  As well as exercise.

## 2019-11-27 ENCOUNTER — Other Ambulatory Visit (HOSPITAL_COMMUNITY)
Admission: AD | Admit: 2019-11-27 | Discharge: 2019-11-27 | Disposition: A | Discharge status: Home / Self Care | Payer: Medicare HMO | Source: Skilled Nursing Facility | Attending: Family Medicine | Admitting: Family Medicine

## 2019-11-27 ENCOUNTER — Encounter: Payer: Self-pay | Admitting: Family Medicine

## 2019-11-27 ENCOUNTER — Ambulatory Visit (INDEPENDENT_AMBULATORY_CARE_PROVIDER_SITE_OTHER): Payer: Medicare HMO | Admitting: Family Medicine

## 2019-11-27 ENCOUNTER — Other Ambulatory Visit: Payer: Self-pay

## 2019-11-27 VITALS — BP 179/80 | HR 69 | Temp 97.1°F | Resp 18 | Ht 70.0 in | Wt 169.1 lb

## 2019-11-27 DIAGNOSIS — F17219 Nicotine dependence, cigarettes, with unspecified nicotine-induced disorders: Secondary | ICD-10-CM | POA: Diagnosis not present

## 2019-11-27 DIAGNOSIS — I10 Essential (primary) hypertension: Secondary | ICD-10-CM | POA: Diagnosis not present

## 2019-11-27 DIAGNOSIS — E119 Type 2 diabetes mellitus without complications: Secondary | ICD-10-CM | POA: Insufficient documentation

## 2019-11-27 NOTE — Progress Notes (Signed)
Subjective:  Patient ID: Jerry West, male    DOB: 1934-07-17  Age: 85 y.o. MRN: [id number]  CC:  Chief Complaint  Patient presents with  . Follow-up    3 month follow up no other issues at the moment       HPI  HPI Jerry West is an 85 year old male patient who presents today for 29-month follow-up.  He has a history that includes but is not limited to prostate cancer, diabetes diet controlled, cataracts, current tobacco cigarette smoker, hyperlipidemia.  He denies having any issues or concerns today.  Very jovial pleasant conversation.  He reports he stopped taking his Metformin as he does not want to be on any longer as his A1c is improved.  Blood pressure is a little bit on the higher end but he would like to wait on going on any medication today.  He denies having any chest pain, headaches, vision changes, dizziness, hearing changes, memory changes, cough or shortness of breath.  He is still smoking pretty regularly and does not want to quit.  He reports a good appetite he has lost 3 pounds in the last couple months.  He reports his wife is no longer cooking as much so he is not eating as much.  He denies having any blood in urine or stool.  Denies have any trouble making water.  He denies wanting the flu vaccine today.   Today patient denies signs and symptoms of COVID 19 infection including fever, chills, cough, shortness of breath, and headache. Past Medical, Surgical, Social History, Allergies, and Medications have been Reviewed.   Past Medical History:  Diagnosis Date  . Alcoholism in remission ([place]) 12/19/2016  . Cancer [facility])    prostate  . Cataract   . Diabetes mellitus    Type II, denies being a diabetic at this time  . Elevated blood sugar 12/19/2016  . History of prostate cancer 12/19/2016  . HLD (hyperlipidemia) 01/03/2017  . Hypertension   . Tobacco abuse 12/19/2016    Current Meds  Medication Sig  . acetaminophen (TYLENOL) 500 MG tablet  Take 1 tablet (500 mg total) by mouth every 6 (six) hours as needed.  . rosuvastatin (CRESTOR) 10 MG tablet Take 1 tablet (10 mg total) by mouth daily.    ROS:  Review of Systems  Constitutional: Negative.   HENT: Negative.   Eyes: Negative.   Respiratory: Negative.   Cardiovascular: Negative.   Gastrointestinal: Negative.   Genitourinary: Negative.   Musculoskeletal: Negative.   Skin: Negative.   Neurological: Negative.   Endo/Heme/Allergies: Negative.   Psychiatric/Behavioral: Negative.      Objective:   Today's Vitals: BP (!) 179/80 (BP Location: Right Arm, Patient Position: Sitting, Cuff Size: Normal)   Pulse 69   Temp (!) 97.1 F (36.2 C) (Temporal)   Resp 18   Ht 5\' 10"  (1.778 m)   Wt 169 lb 1.9 oz (76.7 kg)   SpO2 96%   BMI 24.27 kg/m  Vitals with BMI 11/27/2019 08/27/2019 08/22/2019  Height 5\' 10"  5\' 10"  -  Weight 169 lbs 2 oz 172 lbs 2 oz -  BMI 88.41 66.0 -  Systolic 630 160 109  Diastolic 80 84 84  Pulse 69 75 63     Physical Exam Vitals and nursing note reviewed.  Constitutional:      Appearance: Normal appearance. He is well-developed, well-groomed and normal weight.  HENT:     Head: Normocephalic and atraumatic.     Right  Ear: External ear normal.     Left Ear: External ear normal.     Mouth/Throat:     Comments: Mask in place   Eyes:     General:        Right eye: No discharge.        Left eye: No discharge.     Conjunctiva/sclera: Conjunctivae normal.  Cardiovascular:     Rate and Rhythm: Normal rate and regular rhythm.     Pulses: Normal pulses.     Heart sounds: Normal heart sounds.  Pulmonary:     Effort: Pulmonary effort is normal.     Breath sounds: Normal breath sounds.  Musculoskeletal:        General: Normal range of motion.     Cervical back: Normal range of motion and neck supple.  Skin:    General: Skin is warm.  Neurological:     General: No focal deficit present.     Mental Status: He is alert and oriented to person,  place, and time.  Psychiatric:        Attention and Perception: Attention normal.        Mood and Affect: Mood normal.        Speech: Speech normal.        Behavior: Behavior normal. Behavior is cooperative.        Thought Content: Thought content normal.        Cognition and Memory: Cognition normal.        Judgment: Judgment normal.     Comments: jovial in conversation       Assessment   1. Type 2 diabetes mellitus without complication, without long-term current use of insulin ([place])   2. Essential hypertension   3. Cigarette nicotine dependence with nicotine-induced disorder     Tests ordered Orders Placed This Encounter  Procedures  . Microalbumin, urine  . CBC  . Comprehensive metabolic panel  . Hemoglobin A1c     Plan: Please see assessment and plan per problem list above.   No orders of the defined types were placed in this encounter.   Patient to follow-up in 03/31/2020.  Perlie Mayo, NP

## 2019-11-27 NOTE — Assessment & Plan Note (Signed)
Jerry West is encouraged to maintain a well balanced diet that is low in salt. Declines medication to keep in normal range consistently. Will get updated labs Additionally, he is also reminded that exercise is beneficial for heart health and control of Blood pressure. 30-60 minutes daily is recommended-walking was suggested.

## 2019-11-27 NOTE — Patient Instructions (Addendum)
I appreciate the opportunity to provide you with care for your health and wellness. Today we discussed: overall health   Follow up: 4 months   Labs in one month around Oct 23-26 at [facility] No referrals today      Please continue to practice social distancing to keep you, your family, and our community safe.  If you must go out, please wear a mask and practice good handwashing.  It was a pleasure to see you and I look forward to continuing to work together on your health and well-being. Please do not hesitate to call the office if you need care or have questions about your care.  Have a wonderful day and week. With Gratitude, Cherly Beach, DNP, AGNP-BC

## 2019-11-27 NOTE — Assessment & Plan Note (Signed)
He has stopped metformin on his own. He no longer wants to take it. Will get updated labs and see what A1c is as well as kidney function. Encouraged a heart healthy diabetic friendly diet for now.

## 2019-11-27 NOTE — Assessment & Plan Note (Signed)
Asked about quitting: confirms they are currently smokes cigarettes Advise to quit smoking: Educated about QUITTING to reduce the risk of cancer, cardio and cerebrovascular disease. Assess willingness: Unwilling to quit at this time, but is working on cutting back. Assist with counseling and pharmacotherapy: Counseled for 5 minutes and literature provided. Arrange for follow up:  not quitting follow up in 3 months and continue to offer help.   

## 2019-11-28 LAB — MICROALBUMIN, URINE: Microalb, Ur: 49.7 ug/mL — ABNORMAL HIGH

## 2020-01-07 ENCOUNTER — Encounter: Payer: Self-pay | Admitting: Family Medicine

## 2020-01-07 ENCOUNTER — Telehealth (INDEPENDENT_AMBULATORY_CARE_PROVIDER_SITE_OTHER): Payer: Medicare HMO | Admitting: Family Medicine

## 2020-01-07 ENCOUNTER — Other Ambulatory Visit: Payer: Self-pay

## 2020-01-07 VITALS — Ht 70.0 in | Wt 174.0 lb

## 2020-01-07 DIAGNOSIS — Z0000 Encounter for general adult medical examination without abnormal findings: Secondary | ICD-10-CM | POA: Diagnosis not present

## 2020-01-07 NOTE — Patient Instructions (Signed)
Jerry West , Thank you for taking time to come for your Medicare Wellness Visit. I appreciate your ongoing commitment to your health goals. Please review the following plan we discussed and let me know if I can assist you in the future.   Screening recommendations/referrals: Colonoscopy: No longer needed Recommended yearly ophthalmology/optometry visit for glaucoma screening and checkup Recommended yearly dental visit for hygiene and checkup  Vaccinations: Influenza vaccine: Declined Pneumococcal vaccine: Declined Tdap vaccine: Due Shingles vaccine: Declined  Advanced directives: No  Conditions/risks identified: None  Next appointment: 03/31/20 @ 11 am.  Preventive Care 65 Years and Older, Male Preventive care refers to lifestyle choices and visits with your health care provider that can promote health and wellness. What does preventive care include?  A yearly physical exam. This is also called an annual well check.  Dental exams once or twice a year.  Routine eye exams. Ask your health care provider how often you should have your eyes checked.  Personal lifestyle choices, including:  Daily care of your teeth and gums.  Regular physical activity.  Eating a healthy diet.  Avoiding tobacco and drug use.  Limiting alcohol use.  Practicing safe sex.  Taking low doses of aspirin every day.  Taking vitamin and mineral supplements as recommended by your health care provider. What happens during an annual well check? The services and screenings done by your health care provider during your annual well check will depend on your age, overall health, lifestyle risk factors, and family history of disease. Counseling  Your health care provider may ask you questions about your:  Alcohol use.  Tobacco use.  Drug use.  Emotional well-being.  Home and relationship well-being.  Sexual activity.  Eating habits.  History of falls.  Memory and ability to understand  (cognition).  Work and work Statistician. Screening  You may have the following tests or measurements:  Height, weight, and BMI.  Blood pressure.  Lipid and cholesterol levels. These may be checked every 5 years, or more frequently if you are over 32 years old.  Skin check.  Lung cancer screening. You may have this screening every year starting at age 45 if you have a 30-pack-year history of smoking and currently smoke or have quit within the past 15 years.  Fecal occult blood test (FOBT) of the stool. You may have this test every year starting at age 74.  Flexible sigmoidoscopy or colonoscopy. You may have a sigmoidoscopy every 5 years or a colonoscopy every 10 years starting at age 46.  Prostate cancer screening. Recommendations will vary depending on your family history and other risks.  Hepatitis C blood test.  Hepatitis B blood test.  Sexually transmitted disease (STD) testing.  Diabetes screening. This is done by checking your blood sugar (glucose) after you have not eaten for a while (fasting). You may have this done every 1-3 years.  Abdominal aortic aneurysm (AAA) screening. You may need this if you are a current or former smoker.  Osteoporosis. You may be screened starting at age 75 if you are at high risk. Talk with your health care provider about your test results, treatment options, and if necessary, the need for more tests. Vaccines  Your health care provider may recommend certain vaccines, such as:  Influenza vaccine. This is recommended every year.  Tetanus, diphtheria, and acellular pertussis (Tdap, Td) vaccine. You may need a Td booster every 10 years.  Zoster vaccine. You may need this after age 58.  Pneumococcal 13-valent conjugate (PCV13)  vaccine. One dose is recommended after age 69.  Pneumococcal polysaccharide (PPSV23) vaccine. One dose is recommended after age 76. Talk to your health care provider about which screenings and vaccines you need and  how often you need them. This information is not intended to replace advice given to you by your health care provider. Make sure you discuss any questions you have with your health care provider. Document Released: 03/19/2015 Document Revised: 11/10/2015 Document Reviewed: 12/22/2014 Elsevier Interactive Patient Education  2017 [place] Prevention in the Home Falls can cause injuries. They can happen to people of all ages. There are many things you can do to make your home safe and to help prevent falls. What can I do on the outside of my home?  Regularly fix the edges of walkways and driveways and fix any cracks.  Remove anything that might make you trip as you walk through a door, such as a raised step or threshold.  Trim any bushes or trees on the path to your home.  Use bright outdoor lighting.  Clear any walking paths of anything that might make someone trip, such as rocks or tools.  Regularly check to see if handrails are loose or broken. Make sure that both sides of any steps have handrails.  Any raised decks and porches should have guardrails on the edges.  Have any leaves, snow, or ice cleared regularly.  Use sand or salt on walking paths during winter.  Clean up any spills in your garage right away. This includes oil or grease spills. What can I do in the bathroom?  Use night lights.  Install grab bars by the toilet and in the tub and shower. Do not use towel bars as grab bars.  Use non-skid mats or decals in the tub or shower.  If you need to sit down in the shower, use a plastic, non-slip stool.  Keep the floor dry. Clean up any water that spills on the floor as soon as it happens.  Remove soap buildup in the tub or shower regularly.  Attach bath mats securely with double-sided non-slip rug tape.  Do not have throw rugs and other things on the floor that can make you trip. What can I do in the bedroom?  Use night lights.  Make sure that you  have a light by your bed that is easy to reach.  Do not use any sheets or blankets that are too big for your bed. They should not hang down onto the floor.  Have a firm chair that has side arms. You can use this for support while you get dressed.  Do not have throw rugs and other things on the floor that can make you trip. What can I do in the kitchen?  Clean up any spills right away.  Avoid walking on wet floors.  Keep items that you use a lot in easy-to-reach places.  If you need to reach something above you, use a strong step stool that has a grab bar.  Keep electrical cords out of the way.  Do not use floor polish or wax that makes floors slippery. If you must use wax, use non-skid floor wax.  Do not have throw rugs and other things on the floor that can make you trip. What can I do with my stairs?  Do not leave any items on the stairs.  Make sure that there are handrails on both sides of the stairs and use them. Fix handrails that are  broken or loose. Make sure that handrails are as long as the stairways.  Check any carpeting to make sure that it is firmly attached to the stairs. Fix any carpet that is loose or worn.  Avoid having throw rugs at the top or bottom of the stairs. If you do have throw rugs, attach them to the floor with carpet tape.  Make sure that you have a light switch at the top of the stairs and the bottom of the stairs. If you do not have them, ask someone to add them for you. What else can I do to help prevent falls?  Wear shoes that:  Do not have high heels.  Have rubber bottoms.  Are comfortable and fit you well.  Are closed at the toe. Do not wear sandals.  If you use a stepladder:  Make sure that it is fully opened. Do not climb a closed stepladder.  Make sure that both sides of the stepladder are locked into place.  Ask someone to hold it for you, if possible.  Clearly mark and make sure that you can see:  Any grab bars or  handrails.  First and last steps.  Where the edge of each step is.  Use tools that help you move around (mobility aids) if they are needed. These include:  Canes.  Walkers.  Scooters.  Crutches.  Turn on the lights when you go into a dark area. Replace any light bulbs as soon as they burn out.  Set up your furniture so you have a clear path. Avoid moving your furniture around.  If any of your floors are uneven, fix them.  If there are any pets around you, be aware of where they are.  Review your medicines with your doctor. Some medicines can make you feel dizzy. This can increase your chance of falling. Ask your doctor what other things that you can do to help prevent falls. This information is not intended to replace advice given to you by your health care provider. Make sure you discuss any questions you have with your health care provider. Document Released: 12/17/2008 Document Revised: 07/29/2015 Document Reviewed: 03/27/2014 Elsevier Interactive Patient Education  2017 [employer].

## 2020-01-07 NOTE — Progress Notes (Addendum)
Subjective:   Jerry West is a 85 y.o. male who presents for Medicare Annual/Subsequent preventive examination.  Method of visit: Telephone  Location of Patient: Home Location of Provider: Office/[place] Consent was obtain for visit over the telephone. Services rendered by provider: Visit was performed via telephone  I verified that I am speaking with the correct person using two identifiers.      Review of Systems Cardiac Risk Factors include: advanced age (>63men, >71 women)     Objective:    Today's Vitals   01/07/20 0941 01/07/20 0943  Weight: 174 lb (78.9 kg)   Height: 5\' 10"  (1.778 m)   PainSc: 0-No pain 0-No pain   Body mass index is 24.97 kg/m.  Advanced Directives 01/07/2020 08/22/2019 07/14/2019 03/30/2018 10/05/2016 09/27/2016  Does Patient Have a Medical Advance Directive? No No No No No No  Would patient like information on creating a medical advance directive? No - Patient declined - - No - Patient declined - -    Current Medications (verified) Outpatient Encounter Medications as of 01/07/2020  Medication Sig  . acetaminophen (TYLENOL) 500 MG tablet Take 1 tablet (500 mg total) by mouth every 6 (six) hours as needed.  . rosuvastatin (CRESTOR) 10 MG tablet Take 1 tablet (10 mg total) by mouth daily.   No facility-administered encounter medications on file as of 01/07/2020.    Allergies (verified) Patient has no known allergies.   History: Past Medical History:  Diagnosis Date  . Alcoholism in remission ([place]) 12/19/2016  . Cancer [facility])    prostate  . Cataract   . Diabetes mellitus    Type II, denies being a diabetic at this time  . Elevated blood sugar 12/19/2016  . History of prostate cancer 12/19/2016  . HLD (hyperlipidemia) 01/03/2017  . Hypertension   . Tobacco abuse 12/19/2016   Past Surgical History:  Procedure Laterality Date  . APPENDECTOMY    . CHOLECYSTECTOMY    . FRACTURE SURGERY     Family History  Problem  Relation Age of Onset  . Stroke Mother   . Alzheimer's disease Father   . Alcohol abuse Father   . Arthritis Father    Social History   Socioeconomic History  . Marital status: Married    Spouse name: Jerry West  . Number of children: 3  . Years of education: 6  . Highest education level: Not on file  Occupational History  . 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 Smoking status: Current Every Day Smoker    Packs/day: 0.75    Types: Cigarettes    Start date: 03/06/1946  . Smokeless tobacco: Never Used  Vaping Use  . Vaping Use: Never used  Substance and Sexual Activity  . Alcohol use: No    Comment: prior alcoholic stopped in 8299  . Drug use: No  . Sexual activity: Not Currently  Other Topics Concern  . Not on file  Social History Narrative   Lives with Jerry West 2nd wife    6th grade education   Reads "pretty good"   3 children    Close by       Enjoys: outside cutting grass        Diet: all food groups    Caffeine: 2 cups daily, sodas sometimes     Water: 6-8 cups daily       Wears seat belt   Smoke detectors    Does not use phone  while driving            Social Determinants of Health   Financial Resource Strain: Low Risk   . Difficulty of Paying Living Expenses: Not hard at all  Food Insecurity: No Food Insecurity  . Worried About Charity fundraiser in the Last Year: Never true  . Ran Out of Food in the Last Year: Never true  Transportation Needs: No Transportation Needs  . Lack of Transportation (Medical): No  . Lack of Transportation (Non-Medical): No  Physical Activity: Sufficiently Active  . Days of Exercise per Week: 3 days  . Minutes of Exercise per Session: 60 min  Stress: No Stress Concern Present  . Feeling of Stress : Not at all  Social Connections: Moderately Integrated  . Frequency of Communication with Friends and Family: More than three times a week  . Frequency of Social Gatherings with Friends and Family:  Twice a week  . Attends Religious Services: More than 4 times per year  . Active Member of Clubs or Organizations: No  . Attends Archivist Meetings: Never  . Marital Status: Married    Tobacco Counseling Ready to quit: No Counseling given: Yes   Clinical Intake:     Pain : No/denies pain Pain Score: 0-No pain     BMI - recorded: 24.27 Nutritional Status: BMI of 19-24  Normal Nutritional Risks: None Diabetes: No  How often do you need to have someone help you when you read instructions, pamphlets, or other written materials from your doctor or pharmacy?: 1 - Never What is the last grade level you completed in school?: 6th  Diabetic? no  Interpreter Needed?: No  Information entered by :: Laretta Bolster, LPN   Activities of Daily Living In your present state of health, do you have any difficulty performing the following activities: 01/07/2020 05/21/2019  Hearing? N N  Vision? N N  Difficulty concentrating or making decisions? N N  Walking or climbing stairs? N N  Dressing or bathing? N N  Doing errands, shopping? N N  Preparing Food and eating ? N -  Using the Toilet? N -  In the past six months, have you accidently leaked urine? N -  Do you have problems with loss of bowel control? N -  Managing your Medications? N -  Managing your Finances? N -  Housekeeping or managing your Housekeeping? N -  Some recent data might be hidden    Patient Care Team: Perlie Mayo, NP as PCP - General (Family Medicine)  Indicate any recent Medical Services you may have received from other than [facility] providers in the past year (date may be approximate).     Assessment:   This is a routine wellness examination for Jerry West.  Hearing/Vision screen No exam data present  Dietary issues and exercise activities discussed: Current Exercise Habits: Home exercise routine, Type of exercise: walking;Other - see comments (yard work), Time (Minutes): 60, Frequency  (Times/Week): 3, Weekly Exercise (Minutes/Week): 180, Intensity: Moderate  Goals    . Quit Smoking    . Quit smoking / using tobacco      Depression Screen PHQ 2/9 Scores 01/07/2020 01/07/2020 11/27/2019 08/27/2019 05/21/2019 12/19/2016  PHQ - 2 Score 0 0 0 0 0 0    Fall Risk Fall Risk  01/07/2020 11/27/2019 08/27/2019 05/21/2019 12/19/2016  Falls in the past year? 0 0 0 0 No  Number falls in past yr: 0 0 0 0 -  Injury with Fall? 0 0  0 0 -  Risk for fall due to : No Fall Risks No Fall Risks No Fall Risks - -  Follow up Falls evaluation completed Falls evaluation completed Falls evaluation completed - -    Any stairs in or around the home? no If so, are there any without handrails? n/a Home free of loose throw rugs in walkways, pet beds, electrical cords, etc? yes Adequate lighting in your home to reduce risk of falls? yes  ASSISTIVE DEVICES UTILIZED TO PREVENT FALLS:  Life alert? no Use of a cane, walker or w/c? no Grab bars in the bathroom? no Shower chair or bench in shower? no Elevated toilet seat or a handicapped toilet? no  TIMED UP AND GO:  Was the test performed? no  Length of time to ambulate? n/a  Cognitive Function:     6CIT Screen 01/07/2020  What Year? 0 points  What month? 0 points  What time? 0 points  Count back from 20 0 points  Months in reverse 4 points  Repeat phrase 0 points  Total Score 4    Immunizations  There is no immunization history on file for this patient.  Tdap: due Flu: declined Pneumococcal: declined Covid: declined  Qualifies for Shingles Vaccine? yes Zostavax completed  no Shingrix: no  Screening Tests Health Maintenance  Topic Date Due  . FOOT EXAM  Never done  . OPHTHALMOLOGY EXAM  Never done  . COVID-19 Vaccine (1) Never done  . TETANUS/TDAP  Never done  . PNA vac Low Risk Adult (1 of 2 - PCV13) Never done  . INFLUENZA VACCINE  06/03/2020 (Originally 10/05/2019)  . HEMOGLOBIN A1C  02/26/2020  . URINE MICROALBUMIN   11/26/2020    Health Maintenance  Health Maintenance Due  Topic Date Due  . FOOT EXAM  Never done  . OPHTHALMOLOGY EXAM  Never done  . COVID-19 Vaccine (1) Never done  . TETANUS/TDAP  Never done  . PNA vac Low Risk Adult (1 of 2 - PCV13) Never done    Colorectal: No longer required.  Lung Cancer Screening: (Low Dose CT Chest recommended if Age 53-80 years, 30 pack-year currently smoking OR have quit w/in 15years.) no  Lung Cancer Screening Referral: n/a  Additional Screening:  Hepatitis C Screening: no Complete: n/a  Vision Screening: Recommended annual ophthalmology exams for early detection of glaucoma and other disorders of the eye. Is the patient up to date with their annual eye exam?  yes Who is the provider or what is the name of the office in which the patient attends annual eye exams? [facility] If pt is not established with a provider, would they like to be referred to a provider to establish care? n/a  Dental Screening: Recommended annual dental exams for proper oral hygiene  Community Resource Referral / Chronic Care Management: CRR required this visit? no  CCM required this visit? no     Plan:     1. Encounter for Medicare annual wellness exam   I have personally reviewed and noted the following in the patient's chart:   . Medical and social history . Use of alcohol, tobacco or illicit drugs  . Current medications and supplements . Functional ability and status . Nutritional status . Physical activity . Advanced directives . List of other physicians . Hospitalizations, surgeries, and ER visits in previous 12 months . Vitals . Screenings to include cognitive, depression, and falls . Referrals and appointments  In addition, I have reviewed and discussed with patient  certain preventive protocols, quality metrics, and best practice recommendations. A written personalized care plan for preventive services as well as general preventive health  recommendations were provided to patient.     Perlie Mayo, NP   01/07/2020   Nurse Notes: Anuual Wellness Exam done off site via telephone call. Patient gave consent to visit via audio. Patient was at home. Provider was off site. Visit took 30 minutes to complete.

## 2020-01-17 ENCOUNTER — Other Ambulatory Visit: Payer: Self-pay | Admitting: Family Medicine

## 2020-01-17 DIAGNOSIS — E119 Type 2 diabetes mellitus without complications: Secondary | ICD-10-CM

## 2020-03-18 ENCOUNTER — Other Ambulatory Visit: Payer: Self-pay

## 2020-03-18 ENCOUNTER — Telehealth: Payer: Medicare HMO | Admitting: Family Medicine

## 2020-03-18 ENCOUNTER — Encounter: Payer: Self-pay | Admitting: Family Medicine

## 2020-03-18 ENCOUNTER — Telehealth (INDEPENDENT_AMBULATORY_CARE_PROVIDER_SITE_OTHER): Payer: Medicare HMO | Admitting: Family Medicine

## 2020-03-18 DIAGNOSIS — Z79899 Other long term (current) drug therapy: Secondary | ICD-10-CM | POA: Diagnosis not present

## 2020-03-18 DIAGNOSIS — I10 Essential (primary) hypertension: Secondary | ICD-10-CM

## 2020-03-18 DIAGNOSIS — F17219 Nicotine dependence, cigarettes, with unspecified nicotine-induced disorders: Secondary | ICD-10-CM | POA: Diagnosis not present

## 2020-03-18 DIAGNOSIS — E119 Type 2 diabetes mellitus without complications: Secondary | ICD-10-CM

## 2020-03-18 NOTE — Assessment & Plan Note (Signed)
Reviewed meds in detail. Advised to bring bottles to next appt.

## 2020-03-18 NOTE — Assessment & Plan Note (Signed)
Asked about quitting: confirms they are currently smokes cigarettes Advise to quit smoking: Educated about QUITTING to reduce the risk of cancer, cardio and cerebrovascular disease. Assess willingness: Unwilling to quit at this time, but is working on cutting back. Assist with counseling and pharmacotherapy: Counseled for 5 minutes and literature provided. Arrange for follow up: no desire to stop, not quitting follow up in 3 months and continue to offer help.

## 2020-03-18 NOTE — Progress Notes (Addendum)
Virtual Visit via Telephone Note   This visit type was conducted due to national recommendations for restrictions regarding the COVID-19 Pandemic (e.g. social distancing) in an effort to limit this patient's exposure and mitigate transmission in our community.  Due to his co-morbid illnesses, this patient is at least at moderate risk for complications without adequate follow up.  This format is felt to be most appropriate for this patient at this time.  The patient did not have access to video technology/had technical difficulties with video requiring transitioning to audio format only (telephone).  All issues noted in this document were discussed and addressed.  No physical exam could be performed with this format.     Evaluation Performed:  Follow-up visit  Date:  03/18/2020   ID:  Jerry West, DOB February 08, 1935, MRN [id number]  Patient Location: Home Provider Location: Office/Clinic   Participants: Nurse/CMA for intake and work up; Patient and Provider for Visit and Wrap up  Method of visit: Telephone  Location of Patient: Home Location of Provider: Office Consent was obtain for visit over the telephone. Services rendered by provider: Visit was performed via telephone   I verified that I am speaking with the correct person using two identifiers.  PCP:  Perlie Mayo, NP   Chief Complaint:  BP, DM -diet controlled   History of Present Illness:    Jerry West is a 86 y.o. male  who presents today for 26-month follow-up.  He has a history that includes but is not limited to prostate cancer, diabetes diet controlled, cataracts, current tobacco cigarette smoker, hyperlipidemia.  He denies having any issues or concerns today.  Very jovial pleasant conversation.  He had stopped his metformin on his own- needs updated labs for A1c at next visit.   BP is usually on the higher end, but he does not have any readings for me today and reports it is "ok when he checks  it" he also decline starting any medication during his last visit.  He denies having any chest pain, headaches, vision changes, dizziness, hearing changes, memory changes, cough or shortness of breath.  He is still smoking pretty regularly and does not want to quit.  He reports a good appetite.  He denies having any blood in urine or stool.  Denies have any trouble making water.  The patient does not have symptoms concerning for COVID-19 infection (fever, chills, cough, or new shortness of breath).   Past Medical, Surgical, Social History, Allergies, and Medications have been Reviewed.  Past Medical History:  Diagnosis Date  . Alcoholism in remission ([place]) 12/19/2016  . Cancer [facility])    prostate  . Cataract   . Diabetes mellitus    Type II, denies being a diabetic at this time  . Elevated blood sugar 12/19/2016  . History of prostate cancer 12/19/2016  . HLD (hyperlipidemia) 01/03/2017  . Hypertension   . Tobacco abuse 12/19/2016   Past Surgical History:  Procedure Laterality Date  . APPENDECTOMY    . CHOLECYSTECTOMY    . FRACTURE SURGERY       Current Meds  Medication Sig  . acetaminophen (TYLENOL) 500 MG tablet Take 1 tablet (500 mg total) by mouth every 6 (six) hours as needed.  . rosuvastatin (CRESTOR) 10 MG tablet Take 1 tablet (10 mg total) by mouth daily.     Allergies:   Patient has no known allergies.   ROS:   Please see the history of present illness.  All other systems reviewed and are negative.   Labs/Other Tests and Data Reviewed:    Recent Labs: 05/22/2019: ALT 5 08/22/2019: BUN 12; Creatinine, Ser 1.16; Hemoglobin 15.4; Platelets 194; Potassium 4.1; Sodium 138   Recent Lipid Panel Lab Results  Component Value Date/Time   CHOL 210 (H) 05/22/2019 09:00 AM   TRIG 162 (H) 05/22/2019 09:00 AM   HDL 33 (L) 05/22/2019 09:00 AM   CHOLHDL 6.4 (H) 05/22/2019 09:00 AM   LDLCALC 147 (H) 05/22/2019 09:00 AM    Wt Readings from Last 3 Encounters:  01/07/20 174  lb (78.9 kg)  11/27/19 169 lb 1.9 oz (76.7 kg)  08/27/19 172 lb 1.9 oz (78.1 kg)     Objective:    Vital Signs:  There were no vitals taken for this visit.   VITAL SIGNS:  reviewed GEN:  no acute distress RESPIRATORY:  no shortness of breath in conversation PSYCH:  normal affect  ASSESSMENT & PLAN:    1. Essential hypertension   2. Type 2 diabetes mellitus without complication, without long-term current use of insulin ([place])   3. Cigarette nicotine dependence with nicotine-induced disorder  4. Medication management     Time:   Today, I have spent 7 minutes with the patient with telehealth technology discussing the above problems.     Medication Adjustments/Labs and Tests Ordered: Current medicines are reviewed at length with the patient today.  Concerns regarding medicines are outlined above.   Tests Ordered: No orders of the defined types were placed in this encounter.   Medication Changes: No orders of the defined types were placed in this encounter.    Disposition:  Follow up 3 months for A1c check and other labs as needed Signed, Perlie Mayo, NP  03/18/2020 1:11 PM     [facility]

## 2020-03-18 NOTE — Patient Instructions (Addendum)
  I appreciate the opportunity to provide you with care for your health and wellness.  Follow up: 3 months in the office for A1c check  No labs or referrals today  Continue to be safe during this current surge of COVID. Call if you have any problems before your next appt.   Please continue to practice social distancing to keep you, your family, and our community safe.  If you must go out, please wear a mask and practice good handwashing.  It was a pleasure to see you and I look forward to continuing to work together on your health and well-being. Please do not hesitate to call the office if you need care or have questions about your care.  Have a wonderful day. With Gratitude, Cherly Beach, DNP, AGNP-BC

## 2020-03-18 NOTE — Assessment & Plan Note (Signed)
Had previously stopped metformin. Will need updated labs at his next visit.

## 2020-03-18 NOTE — Assessment & Plan Note (Signed)
Jerry West is encouraged to maintain a well balanced diet that is low in salt. Unsure of readings as he did not provide a number range. Previsouly higher end of controlled. Will assess at net visit.   Additionally, heis also reminded that exercise is beneficial for heart health and control of Blood pressure. 30-60 minutes daily is recommended-walking was suggested.  In addition he is encouraged on smoking cessation

## 2020-03-31 ENCOUNTER — Ambulatory Visit: Payer: Medicare HMO | Admitting: Family Medicine

## 2020-06-17 ENCOUNTER — Other Ambulatory Visit: Payer: Self-pay

## 2020-06-17 ENCOUNTER — Ambulatory Visit (INDEPENDENT_AMBULATORY_CARE_PROVIDER_SITE_OTHER): Payer: Medicare HMO | Admitting: Nurse Practitioner

## 2020-06-17 ENCOUNTER — Encounter: Payer: Self-pay | Admitting: Nurse Practitioner

## 2020-06-17 DIAGNOSIS — I10 Essential (primary) hypertension: Secondary | ICD-10-CM

## 2020-06-17 DIAGNOSIS — F17219 Nicotine dependence, cigarettes, with unspecified nicotine-induced disorders: Secondary | ICD-10-CM | POA: Diagnosis not present

## 2020-06-17 DIAGNOSIS — E785 Hyperlipidemia, unspecified: Secondary | ICD-10-CM | POA: Diagnosis not present

## 2020-06-17 DIAGNOSIS — E119 Type 2 diabetes mellitus without complications: Secondary | ICD-10-CM

## 2020-06-17 MED ORDER — OLMESARTAN MEDOXOMIL 20 MG PO TABS: 20.0000 mg | ORAL_TABLET | Freq: Every day | ORAL | 0 refills | Status: DC

## 2020-06-17 NOTE — Assessment & Plan Note (Signed)
-  still smokes -smoked just before coming into his visit today; may be contributory to his elevated BP reading

## 2020-06-17 NOTE — Assessment & Plan Note (Addendum)
-  significant elevation today; asymptomatic -Rx. olmesartan -we discussed red flags, and he will go to hospital if he has severe headache, slurred speech, facial droop, etc.

## 2020-06-17 NOTE — Patient Instructions (Signed)
Please have fasting labs drawn 2-3 days prior to your appointment so we can discuss the results during your office visit.  

## 2020-06-17 NOTE — Assessment & Plan Note (Signed)
-  will check labs

## 2020-06-17 NOTE — Assessment & Plan Note (Addendum)
Lab Results  Component Value Date   HGBA1C 6.7 (A) 08/27/2019   HGBA1C 6.7 08/27/2019   HGBA1C 6.7 (A) 08/27/2019   HGBA1C 6.7 08/27/2019  -will check labs today -on statin -starting olmesartan today

## 2020-06-17 NOTE — Progress Notes (Signed)
Acute Office Visit  Subjective:    Patient ID: Jerry West, male    DOB: [birth year], 86 y.o.   MRN: [id number]  No chief complaint on file.   HPI Patient is in today for diabetic follow-up. His last A1c was 6.7, but he does not take meds for this.  No recent labs.  He states he eats a little strawberry jelly every morning. He smokes, and he states that he smoked a cigarette on the ride to his appointment.  BP is significantly elevated today.  Past Medical History:  Diagnosis Date  . Alcoholism in remission ([place]) 12/19/2016  . Cancer [facility])    prostate  . Cataract   . Diabetes mellitus    Type II, denies being a diabetic at this time  . Elevated blood sugar 12/19/2016  . History of prostate cancer 12/19/2016  . HLD (hyperlipidemia) 01/03/2017  . Hypertension   . Tobacco abuse 12/19/2016    Past Surgical History:  Procedure Laterality Date  . APPENDECTOMY    . CHOLECYSTECTOMY    . FRACTURE SURGERY      Family History  Problem Relation Age of Onset  . Stroke Mother   . Alzheimer's disease Father   . Alcohol abuse Father   . Arthritis Father     Social History   Socioeconomic History  . Marital status: Married    Spouse name: Enid Derry  . Number of children: 3  . Years of education: 6  . Highest education level: Not on file  Occupational History  . 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 Smoking status: Current Every Day Smoker    Packs/day: 0.75    Types: Cigarettes    Start date: 03/06/1946  . Smokeless tobacco: Never Used  Vaping Use  . Vaping Use: Never used  Substance and Sexual Activity  . Alcohol use: No    Comment: prior alcoholic stopped in 7673  . Drug use: No  . Sexual activity: Not Currently  Other Topics Concern  . Not on file  Social History Narrative   Lives with Enid Derry 2nd wife    6th grade education   Reads "pretty good"   3 children    Close by       Enjoys: outside cutting grass         Diet: all food groups    Caffeine: 2 cups daily, sodas sometimes     Water: 6-8 cups daily       Wears seat belt   Smoke detectors    Does not use phone while driving            Social Determinants of Health   Financial Resource Strain: Low Risk   . Difficulty of Paying Living Expenses: Not hard at all  Food Insecurity: No Food Insecurity  . Worried About Charity fundraiser in the Last Year: Never true  . Ran Out of Food in the Last Year: Never true  Transportation Needs: No Transportation Needs  . Lack of Transportation (Medical): No  . Lack of Transportation (Non-Medical): No  Physical Activity: Sufficiently Active  . Days of Exercise per Week: 3 days  . Minutes of Exercise per Session: 60 min  Stress: No Stress Concern Present  . Feeling of Stress : Not at all  Social Connections: Moderately Integrated  . Frequency of Communication with Friends and Family: More than three times a week  . Frequency of Social Gatherings with Friends and  Family: Twice a week  . Attends Religious Services: More than 4 times per year  . Active Member of Clubs or Organizations: No  . Attends Archivist Meetings: Never  . Marital Status: Married  Human resources officer Violence: Not At Risk  . Fear of Current or Ex-Partner: No  . Emotionally Abused: No  . Physically Abused: No  . Sexually Abused: No    Outpatient Medications Prior to Visit  Medication Sig Dispense Refill  . acetaminophen (TYLENOL) 500 MG tablet Take 1 tablet (500 mg total) by mouth every 6 (six) hours as needed. 30 tablet 0  . rosuvastatin (CRESTOR) 10 MG tablet Take 1 tablet (10 mg total) by mouth daily. 30 tablet 1   No facility-administered medications prior to visit.    No Known Allergies  Review of Systems  Constitutional: Negative.   Respiratory: Negative.   Cardiovascular: Negative.   Musculoskeletal: Negative.   Psychiatric/Behavioral: Negative.        Objective:    Physical Exam Constitutional:       Appearance: Normal appearance.  Cardiovascular:     Rate and Rhythm: Normal rate and regular rhythm.     Pulses: Normal pulses.     Heart sounds: Normal heart sounds.     Comments: BP significantly elevated Pulmonary:     Effort: Pulmonary effort is normal.     Breath sounds: Normal breath sounds.  Musculoskeletal:        General: Normal range of motion.  Neurological:     Mental Status: He is alert.  Psychiatric:        Mood and Affect: Mood normal.        Behavior: Behavior normal.        Thought Content: Thought content normal.        Judgment: Judgment normal.     There were no vitals taken for this visit. Wt Readings from Last 3 Encounters:  01/07/20 174 lb (78.9 kg)  11/27/19 169 lb 1.9 oz (76.7 kg)  08/27/19 172 lb 1.9 oz (78.1 kg)    Health Maintenance Due  Topic Date Due  . COVID-19 Vaccine (1) Never done  . FOOT EXAM  Never done  . OPHTHALMOLOGY EXAM  Never done  . TETANUS/TDAP  Never done  . PNA vac Low Risk Adult (1 of 2 - PCV13) Never done  . HEMOGLOBIN A1C  02/26/2020    There are no preventive care reminders to display for this patient.   Lab Results  Component Value Date   TSH 1.156 03/14/2010   Lab Results  Component Value Date   WBC 4.0 08/22/2019   HGB 15.4 08/22/2019   HCT 46.6 08/22/2019   MCV 93.4 08/22/2019   PLT 194 08/22/2019   Lab Results  Component Value Date   NA 138 08/22/2019   K 4.1 08/22/2019   CO2 31 08/22/2019   GLUCOSE 97 08/22/2019   BUN 12 08/22/2019   CREATININE 1.16 08/22/2019   BILITOT 1.0 05/22/2019   ALKPHOS 54 03/14/2010   AST 10 05/22/2019   ALT 5 (L) 05/22/2019   PROT 7.0 05/22/2019   ALBUMIN 2.5 (L) 03/14/2010   CALCIUM 9.1 08/22/2019   ANIONGAP 9 08/22/2019   Lab Results  Component Value Date   CHOL 210 (H) 05/22/2019   Lab Results  Component Value Date   HDL 33 (L) 05/22/2019   Lab Results  Component Value Date   LDLCALC 147 (H) 05/22/2019   Lab Results  Component Value Date  TRIG  162 (H) 05/22/2019   Lab Results  Component Value Date   CHOLHDL 6.4 (H) 05/22/2019   Lab Results  Component Value Date   HGBA1C 6.7 (A) 08/27/2019   HGBA1C 6.7 08/27/2019   HGBA1C 6.7 (A) 08/27/2019   HGBA1C 6.7 08/27/2019       Assessment & Plan:   Problem List Items Addressed This Visit   None      No orders of the defined types were placed in this encounter.    Noreene Larsson, NP

## 2020-06-24 ENCOUNTER — Ambulatory Visit: Payer: Medicare HMO | Admitting: Nurse Practitioner

## 2020-07-01 ENCOUNTER — Ambulatory Visit: Payer: Medicare HMO | Admitting: Nurse Practitioner

## 2020-07-02 ENCOUNTER — Encounter: Payer: Self-pay | Admitting: Nurse Practitioner

## 2020-07-02 ENCOUNTER — Ambulatory Visit (INDEPENDENT_AMBULATORY_CARE_PROVIDER_SITE_OTHER): Payer: Medicare HMO | Admitting: Nurse Practitioner

## 2020-07-02 ENCOUNTER — Other Ambulatory Visit: Payer: Self-pay

## 2020-07-02 DIAGNOSIS — I10 Essential (primary) hypertension: Secondary | ICD-10-CM | POA: Diagnosis not present

## 2020-07-02 MED ORDER — AMLODIPINE-OLMESARTAN 5-40 MG PO TABS: 1.0000 | ORAL_TABLET | Freq: Every day | ORAL | 1 refills | Status: DC

## 2020-07-02 NOTE — Progress Notes (Signed)
Acute Office Visit  Subjective:    Patient ID: Jerry West, male    DOB: Jul 07, 1934, 86 y.o.   MRN: [id number]  Chief Complaint  Patient presents with  . Hypertension    HPI Patient is in today for BP check. At his last OV, he was started on olmesartan for BP 209/95.   Past Medical History:  Diagnosis Date  . Alcoholism in remission ([place]) 12/19/2016  . Cancer [facility])    prostate  . Cataract   . Diabetes mellitus    Type II, denies being a diabetic at this time  . Elevated blood sugar 12/19/2016  . History of prostate cancer 12/19/2016  . HLD (hyperlipidemia) 01/03/2017  . Hypertension   . Tobacco abuse 12/19/2016    Past Surgical History:  Procedure Laterality Date  . APPENDECTOMY    . CHOLECYSTECTOMY    . FRACTURE SURGERY      Family History  Problem Relation Age of Onset  . Stroke Mother   . Alzheimer's disease Father   . Alcohol abuse Father   . Arthritis Father     Social History   Socioeconomic History  . Marital status: Married    Spouse name: Enid Derry  . Number of children: 3  . Years of education: 6  . Highest education level: Not on file  Occupational History  . 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 Smoking status: Current Every Day Smoker    Packs/day: 0.75    Types: Cigarettes    Start date: 03/06/1946  . Smokeless tobacco: Never Used  Vaping Use  . Vaping Use: Never used  Substance and Sexual Activity  . Alcohol use: No    Comment: prior alcoholic stopped in 7510  . Drug use: No  . Sexual activity: Not Currently  Other Topics Concern  . Not on file  Social History Narrative   Lives with Enid Derry 2nd wife    6th grade education   Reads "pretty good"   3 children    Close by       Enjoys: outside cutting grass        Diet: all food groups    Caffeine: 2 cups daily, sodas sometimes     Water: 6-8 cups daily       Wears seat belt   Smoke detectors    Does not use phone while driving             Social Determinants of Health   Financial Resource Strain: Low Risk   . Difficulty of Paying Living Expenses: Not hard at all  Food Insecurity: No Food Insecurity  . Worried About Charity fundraiser in the Last Year: Never true  . Ran Out of Food in the Last Year: Never true  Transportation Needs: No Transportation Needs  . Lack of Transportation (Medical): No  . Lack of Transportation (Non-Medical): No  Physical Activity: Sufficiently Active  . Days of Exercise per Week: 3 days  . Minutes of Exercise per Session: 60 min  Stress: No Stress Concern Present  . Feeling of Stress : Not at all  Social Connections: Moderately Integrated  . Frequency of Communication with Friends and Family: More than three times a week  . Frequency of Social Gatherings with Friends and Family: Twice a week  . Attends Religious Services: More than 4 times per year  . Active Member of Clubs or Organizations: No  . Attends Archivist Meetings: Never  .  Marital Status: Married  Human resources officer Violence: Not At Risk  . Fear of Current or Ex-Partner: No  . Emotionally Abused: No  . Physically Abused: No  . Sexually Abused: No    Outpatient Medications Prior to Visit  Medication Sig Dispense Refill  . acetaminophen (TYLENOL) 500 MG tablet Take 1 tablet (500 mg total) by mouth every 6 (six) hours as needed. 30 tablet 0  . rosuvastatin (CRESTOR) 10 MG tablet Take 1 tablet (10 mg total) by mouth daily. 30 tablet 1  . olmesartan (BENICAR) 20 MG tablet Take 1 tablet (20 mg total) by mouth daily. 30 tablet 0   No facility-administered medications prior to visit.    No Known Allergies  Review of Systems  Constitutional: Negative.   Respiratory: Negative.   Cardiovascular: Negative.   Musculoskeletal: Negative.   Psychiatric/Behavioral: Negative.        Objective:    Physical Exam Constitutional:      Appearance: Normal appearance.  Cardiovascular:     Rate and Rhythm: Normal  rate and regular rhythm.     Pulses: Normal pulses.     Heart sounds: Normal heart sounds.  Pulmonary:     Effort: Pulmonary effort is normal.     Breath sounds: Normal breath sounds.  Neurological:     Mental Status: He is alert.  Psychiatric:        Mood and Affect: Mood normal.        Behavior: Behavior normal.        Thought Content: Thought content normal.        Judgment: Judgment normal.     BP (!) 197/81   Pulse 80   Temp 98.2 F (36.8 C)   Resp 20   Ht 5' 10.5" (1.791 m)   Wt 178 lb (80.7 kg)   SpO2 95%   BMI 25.18 kg/m  Wt Readings from Last 3 Encounters:  07/02/20 178 lb (80.7 kg)  06/17/20 178 lb (80.7 kg)  01/07/20 174 lb (78.9 kg)    Health Maintenance Due  Topic Date Due  . COVID-19 Vaccine (1) Never done  . FOOT EXAM  Never done  . OPHTHALMOLOGY EXAM  Never done  . TETANUS/TDAP  Never done  . PNA vac Low Risk Adult (1 of 2 - PCV13) Never done  . HEMOGLOBIN A1C  02/26/2020    There are no preventive care reminders to display for this patient.   Lab Results  Component Value Date   TSH 1.156 03/14/2010   Lab Results  Component Value Date   WBC 4.0 08/22/2019   HGB 15.4 08/22/2019   HCT 46.6 08/22/2019   MCV 93.4 08/22/2019   PLT 194 08/22/2019   Lab Results  Component Value Date   NA 138 08/22/2019   K 4.1 08/22/2019   CO2 31 08/22/2019   GLUCOSE 97 08/22/2019   BUN 12 08/22/2019   CREATININE 1.16 08/22/2019   BILITOT 1.0 05/22/2019   ALKPHOS 54 03/14/2010   AST 10 05/22/2019   ALT 5 (L) 05/22/2019   PROT 7.0 05/22/2019   ALBUMIN 2.5 (L) 03/14/2010   CALCIUM 9.1 08/22/2019   ANIONGAP 9 08/22/2019   Lab Results  Component Value Date   CHOL 210 (H) 05/22/2019   Lab Results  Component Value Date   HDL 33 (L) 05/22/2019   Lab Results  Component Value Date   LDLCALC 147 (H) 05/22/2019   Lab Results  Component Value Date   TRIG 162 (H) 05/22/2019  Lab Results  Component Value Date   CHOLHDL 6.4 (H) 05/22/2019   Lab  Results  Component Value Date   HGBA1C 6.7 (A) 08/27/2019   HGBA1C 6.7 08/27/2019   HGBA1C 6.7 (A) 08/27/2019   HGBA1C 6.7 08/27/2019       Assessment & Plan:   Problem List Items Addressed This Visit      Cardiovascular and Mediastinum   Essential hypertension    -significant elevation today; asymptomatic -Rx. Amlodipine-olmesartan; (was just on olmesartan) -we discussed red flags, and he will go to hospital if he has severe headache, slurred speech, facial droop, etc. -smoked a cigarette prior to having his BP checked      Relevant Medications   amLODipine-olmesartan (AZOR) 5-40 MG tablet       Meds ordered this encounter  Medications  . amLODipine-olmesartan (AZOR) 5-40 MG tablet    Sig: Take 1 tablet by mouth daily.    Dispense:  30 tablet    Refill:  [place], NP

## 2020-07-02 NOTE — Patient Instructions (Signed)
Please have fasting labs drawn 2-3 days prior to your appointment so we can discuss the results during your office visit.  

## 2020-07-02 NOTE — Assessment & Plan Note (Addendum)
-  significant elevation today; asymptomatic -Rx. Amlodipine-olmesartan; (was just on olmesartan) -we discussed red flags, and he will go to hospital if he has severe headache, slurred speech, facial droop, etc. -smoked a cigarette prior to having his BP checked

## 2020-07-11 ENCOUNTER — Other Ambulatory Visit: Payer: Self-pay | Admitting: Nurse Practitioner

## 2020-07-15 ENCOUNTER — Encounter: Payer: Self-pay | Admitting: Nurse Practitioner

## 2020-07-15 ENCOUNTER — Other Ambulatory Visit: Payer: Self-pay

## 2020-07-15 ENCOUNTER — Ambulatory Visit (INDEPENDENT_AMBULATORY_CARE_PROVIDER_SITE_OTHER): Payer: Medicare HMO | Admitting: Nurse Practitioner

## 2020-07-15 DIAGNOSIS — E785 Hyperlipidemia, unspecified: Secondary | ICD-10-CM

## 2020-07-15 DIAGNOSIS — F17219 Nicotine dependence, cigarettes, with unspecified nicotine-induced disorders: Secondary | ICD-10-CM

## 2020-07-15 DIAGNOSIS — E119 Type 2 diabetes mellitus without complications: Secondary | ICD-10-CM | POA: Diagnosis not present

## 2020-07-15 DIAGNOSIS — I10 Essential (primary) hypertension: Secondary | ICD-10-CM

## 2020-07-15 MED ORDER — AMLODIPINE-OLMESARTAN 10-40 MG PO TABS: 1.0000 | ORAL_TABLET | Freq: Every day | ORAL | 3 refills | Status: DC

## 2020-07-15 NOTE — Assessment & Plan Note (Signed)
-  check A1c with next set of labs

## 2020-07-15 NOTE — Progress Notes (Signed)
Acute Office Visit  Subjective:    Patient ID: Jerry West, male    DOB: Mar 26, 1934, 86 y.o.   MRN: [id number]  Chief Complaint  Patient presents with  . Hypertension    HPI Patient is in today for blood pressure check. At his last OV, we added amlodipine to his olmesartan when his BP was 197/81.  He is a smoker, and his last cigarette was just before coming into the office.  Past Medical History:  Diagnosis Date  . Alcoholism in remission ([place]) 12/19/2016  . Cancer [facility])    prostate  . Cataract   . Diabetes mellitus    Type II, denies being a diabetic at this time  . Elevated blood sugar 12/19/2016  . History of prostate cancer 12/19/2016  . HLD (hyperlipidemia) 01/03/2017  . Hypertension   . Tobacco abuse 12/19/2016    Past Surgical History:  Procedure Laterality Date  . APPENDECTOMY    . CHOLECYSTECTOMY    . FRACTURE SURGERY      Family History  Problem Relation Age of Onset  . Stroke Mother   . Alzheimer's disease Father   . Alcohol abuse Father   . Arthritis Father     Social History   Socioeconomic History  . Marital status: Married    Spouse name: Enid Derry  . Number of children: 3  . Years of education: 6  . Highest education level: Not on file  Occupational History  . 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 Smoking status: Current Every Day Smoker    Packs/day: 0.75    Types: Cigarettes    Start date: 03/06/1946  . Smokeless tobacco: Never Used  Vaping Use  . Vaping Use: Never used  Substance and Sexual Activity  . Alcohol use: No    Comment: prior alcoholic stopped in 7681  . Drug use: No  . Sexual activity: Not Currently  Other Topics Concern  . Not on file  Social History Narrative   Lives with Enid Derry 2nd wife    6th grade education   Reads "pretty good"   3 children    Close by       Enjoys: outside cutting grass        Diet: all food groups    Caffeine: 2 cups daily, sodas sometimes      Water: 6-8 cups daily       Wears seat belt   Smoke detectors    Does not use phone while driving            Social Determinants of Health   Financial Resource Strain: Low Risk   . Difficulty of Paying Living Expenses: Not hard at all  Food Insecurity: No Food Insecurity  . Worried About Charity fundraiser in the Last Year: Never true  . Ran Out of Food in the Last Year: Never true  Transportation Needs: No Transportation Needs  . Lack of Transportation (Medical): No  . Lack of Transportation (Non-Medical): No  Physical Activity: Sufficiently Active  . Days of Exercise per Week: 3 days  . Minutes of Exercise per Session: 60 min  Stress: No Stress Concern Present  . Feeling of Stress : Not at all  Social Connections: Moderately Integrated  . Frequency of Communication with Friends and Family: More than three times a week  . Frequency of Social Gatherings with Friends and Family: Twice a week  . Attends Religious Services: More than 4 times  per year  . Active Member of Clubs or Organizations: No  . Attends Archivist Meetings: Never  . Marital Status: Married  Human resources officer Violence: Not At Risk  . Fear of Current or Ex-Partner: No  . Emotionally Abused: No  . Physically Abused: No  . Sexually Abused: No    Outpatient Medications Prior to Visit  Medication Sig Dispense Refill  . acetaminophen (TYLENOL) 500 MG tablet Take 1 tablet (500 mg total) by mouth every 6 (six) hours as needed. 30 tablet 0  . rosuvastatin (CRESTOR) 10 MG tablet Take 1 tablet (10 mg total) by mouth daily. 30 tablet 1  . amLODipine-olmesartan (AZOR) 5-40 MG tablet Take 1 tablet by mouth daily. 30 tablet 1   No facility-administered medications prior to visit.    No Known Allergies  Review of Systems  Constitutional: Negative.   Respiratory: Negative.   Cardiovascular: Negative.   Musculoskeletal: Negative.   Psychiatric/Behavioral: Negative.        Objective:    Physical  Exam Constitutional:      Appearance: Normal appearance.  Cardiovascular:     Rate and Rhythm: Normal rate and regular rhythm.     Pulses: Normal pulses.     Heart sounds: Normal heart sounds.  Pulmonary:     Effort: Pulmonary effort is normal.     Breath sounds: Normal breath sounds.  Neurological:     Mental Status: He is alert.  Psychiatric:        Mood and Affect: Mood normal.        Behavior: Behavior normal.        Thought Content: Thought content normal.        Judgment: Judgment normal.     BP (!) 149/75   Pulse 72   Temp 98.2 F (36.8 C)   Resp 18   Ht 5' 10.5" (1.791 m)   Wt 175 lb (79.4 kg)   SpO2 92%   BMI 24.75 kg/m  Wt Readings from Last 3 Encounters:  07/15/20 175 lb (79.4 kg)  07/02/20 178 lb (80.7 kg)  06/17/20 178 lb (80.7 kg)    Health Maintenance Due  Topic Date Due  . COVID-19 Vaccine (1) Never done  . FOOT EXAM  Never done  . OPHTHALMOLOGY EXAM  Never done  . TETANUS/TDAP  Never done  . PNA vac Low Risk Adult (1 of 2 - PCV13) Never done  . HEMOGLOBIN A1C  02/26/2020    There are no preventive care reminders to display for this patient.   Lab Results  Component Value Date   TSH 1.156 03/14/2010   Lab Results  Component Value Date   WBC 4.0 08/22/2019   HGB 15.4 08/22/2019   HCT 46.6 08/22/2019   MCV 93.4 08/22/2019   PLT 194 08/22/2019   Lab Results  Component Value Date   NA 138 08/22/2019   K 4.1 08/22/2019   CO2 31 08/22/2019   GLUCOSE 97 08/22/2019   BUN 12 08/22/2019   CREATININE 1.16 08/22/2019   BILITOT 1.0 05/22/2019   ALKPHOS 54 03/14/2010   AST 10 05/22/2019   ALT 5 (L) 05/22/2019   PROT 7.0 05/22/2019   ALBUMIN 2.5 (L) 03/14/2010   CALCIUM 9.1 08/22/2019   ANIONGAP 9 08/22/2019   Lab Results  Component Value Date   CHOL 210 (H) 05/22/2019   Lab Results  Component Value Date   HDL 33 (L) 05/22/2019   Lab Results  Component Value Date   LDLCALC 147 (  H) 05/22/2019   Lab Results  Component Value  Date   TRIG 162 (H) 05/22/2019   Lab Results  Component Value Date   CHOLHDL 6.4 (H) 05/22/2019   Lab Results  Component Value Date   HGBA1C 6.7 (A) 08/27/2019   HGBA1C 6.7 08/27/2019   HGBA1C 6.7 (A) 08/27/2019   HGBA1C 6.7 08/27/2019       Assessment & Plan:   Problem List Items Addressed This Visit      Cardiovascular and Mediastinum   Essential hypertension    BP Readings from Last 3 Encounters:  07/15/20 (!) 149/75  07/02/20 (!) 197/81  06/17/20 (!) 209/95   -BP much improved today -INCREASE to amlodipine-olmesartan 10/40       Relevant Medications   amLODipine-olmesartan (AZOR) 10-40 MG tablet   Other Relevant Orders   CBC with Differential/Platelet   CMP14+EGFR   Lipid Panel With LDL/HDL Ratio     Endocrine   Type 2 diabetes mellitus without complication, without long-term current use of insulin (HCC)    -check A1c with next set of labs      Relevant Medications   amLODipine-olmesartan (AZOR) 10-40 MG tablet   Other Relevant Orders   Lipid Panel With LDL/HDL Ratio   Hemoglobin A1c     Nervous and Auditory   Cigarette nicotine dependence with nicotine-induced disorder    -no interest in quitting today        Other   HLD (hyperlipidemia)    -will check lipids with next set of labs      Relevant Medications   amLODipine-olmesartan (AZOR) 10-40 MG tablet   Other Relevant Orders   Lipid Panel With LDL/HDL Ratio       Meds ordered this encounter  Medications  . amLODipine-olmesartan (AZOR) 10-40 MG tablet    Sig: Take 1 tablet by mouth daily.    Dispense:  90 tablet    Refill:  [place], NP

## 2020-07-15 NOTE — Assessment & Plan Note (Signed)
BP Readings from Last 3 Encounters:  07/15/20 (!) 149/75  07/02/20 (!) 197/81  06/17/20 (!) 209/95   -BP much improved today -INCREASE to amlodipine-olmesartan 10/40

## 2020-07-15 NOTE — Patient Instructions (Signed)
Please have fasting labs drawn 2-3 days prior to your appointment so we can discuss the results during your office visit.  

## 2020-07-15 NOTE — Assessment & Plan Note (Signed)
-  no interest in quitting today 

## 2020-07-15 NOTE — Assessment & Plan Note (Signed)
-  will check lipids with next set of labs 

## 2020-08-18 ENCOUNTER — Other Ambulatory Visit: Payer: Self-pay

## 2020-08-18 ENCOUNTER — Ambulatory Visit (INDEPENDENT_AMBULATORY_CARE_PROVIDER_SITE_OTHER): Payer: Medicare HMO | Admitting: Nurse Practitioner

## 2020-08-18 ENCOUNTER — Encounter: Payer: Self-pay | Admitting: Nurse Practitioner

## 2020-08-18 VITALS — BP 151/64 | HR 84 | Temp 98.1°F | Resp 20 | Ht 70.5 in | Wt 170.0 lb

## 2020-08-18 DIAGNOSIS — E119 Type 2 diabetes mellitus without complications: Secondary | ICD-10-CM

## 2020-08-18 DIAGNOSIS — E785 Hyperlipidemia, unspecified: Secondary | ICD-10-CM | POA: Diagnosis not present

## 2020-08-18 DIAGNOSIS — I10 Essential (primary) hypertension: Secondary | ICD-10-CM

## 2020-08-18 NOTE — Patient Instructions (Signed)
Please have fasting labs drawn today. 

## 2020-08-18 NOTE — Assessment & Plan Note (Signed)
-  checking A1c today

## 2020-08-18 NOTE — Assessment & Plan Note (Signed)
BP Readings from Last 3 Encounters:  08/18/20 (!) 151/64  07/15/20 (!) 149/75  07/02/20 (!) 197/81  -continue current meds; don't want to make adjustment without renal fcn data -he smoked prior to coming in, and I don't want him having adverse med effects that would increase fall risk

## 2020-08-18 NOTE — Progress Notes (Signed)
Acute Office Visit  Subjective:    Patient ID: Jerry West, male    DOB: October 20, 1934, 86 y.o.   MRN: [id number]  Chief Complaint  Patient presents with   Hypertension    Hypertension  Patient is in today for BP check. At his last OV, his BP was 149/75, so we increased his amlodipine-olmesartan to 10/40 mg. He denies adverse medication effects. He states he smoked 20 minutes ago.  He didn't have fasting labs drawn prior to today's appointment.  Past Medical History:  Diagnosis Date   Alcoholism in remission ([place]) 12/19/2016   Cancer ([place])    prostate   Cataract    Diabetes mellitus    Type II, denies being a diabetic at this time   Elevated blood sugar 12/19/2016   History of prostate cancer 12/19/2016   HLD (hyperlipidemia) 01/03/2017   Hypertension    Tobacco abuse 12/19/2016    Past Surgical History:  Procedure Laterality Date   APPENDECTOMY     CHOLECYSTECTOMY     FRACTURE SURGERY      Family History  Problem Relation Age of Onset   Stroke Mother    Alzheimer's disease Father    Alcohol abuse Father    Arthritis Father     Social History   Socioeconomic History   Marital status: Married    Spouse name: Enid Derry   Number of children: 3   Years of education: 6   Highest education level: Not on file  Occupational History   Occupation: retired    Comment: farming, [place]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  Tobacco Use   Smoking status: Every Day    Packs/day: 0.75    Pack years: 0.00    Types: Cigarettes    Start date: 03/06/1946   Smokeless tobacco: Never  Vaping Use   Vaping Use: Never used  Substance and Sexual Activity   Alcohol use: No    Comment: prior alcoholic stopped in 0233   Drug use: No   Sexual activity: Not Currently  Other Topics Concern   Not on file  Social History Narrative   Lives with Enid Derry 2nd wife    6th grade education   Reads "pretty good"   3 children    Close by       Enjoys: outside cutting grass        Diet: all food groups     Caffeine: 2 cups daily, sodas sometimes     Water: 6-8 cups daily       Wears seat belt   Smoke detectors    Does not use phone while driving            Social Determinants of Health   Financial Resource Strain: Low Risk    Difficulty of Paying Living Expenses: Not hard at all  Food Insecurity: No Food Insecurity   Worried About Charity fundraiser in the Last Year: Never true   Ran Out of Food in the Last Year: Never true  Transportation Needs: No Transportation Needs   Lack of Transportation (Medical): No   Lack of Transportation (Non-Medical): No  Physical Activity: Sufficiently Active   Days of Exercise per Week: 3 days   Minutes of Exercise per Session: 60 min  Stress: No Stress Concern Present   Feeling of Stress : Not at all  Social Connections: Moderately Integrated   Frequency of Communication with Friends and Family: More than three times a week   Frequency of Social Gatherings with Friends and Family: Twice  a week   Attends Religious Services: More than 4 times per year   Active Member of Clubs or Organizations: No   Attends Archivist Meetings: Never   Marital Status: Married  Human resources officer Violence: Not At Risk   Fear of Current or Ex-Partner: No   Emotionally Abused: No   Physically Abused: No   Sexually Abused: No    Outpatient Medications Prior to Visit  Medication Sig Dispense Refill   acetaminophen (TYLENOL) 500 MG tablet Take 1 tablet (500 mg total) by mouth every 6 (six) hours as needed. 30 tablet 0   amLODipine-olmesartan (AZOR) 10-40 MG tablet Take 1 tablet by mouth daily. 90 tablet 3   rosuvastatin (CRESTOR) 10 MG tablet Take 1 tablet (10 mg total) by mouth daily. 30 tablet 1   No facility-administered medications prior to visit.    No Known Allergies  Review of Systems  Constitutional: Negative.   Respiratory: Negative.    Cardiovascular: Negative.   Musculoskeletal: Negative.   Psychiatric/Behavioral: Negative.         Objective:    Physical Exam Constitutional:      Appearance: Normal appearance.  Cardiovascular:     Rate and Rhythm: Normal rate and regular rhythm.     Pulses: Normal pulses.     Heart sounds: Normal heart sounds.  Pulmonary:     Effort: Pulmonary effort is normal.     Breath sounds: Normal breath sounds.  Musculoskeletal:        General: Normal range of motion.  Neurological:     Mental Status: He is alert.  Psychiatric:        Mood and Affect: Mood normal.        Behavior: Behavior normal.        Thought Content: Thought content normal.        Judgment: Judgment normal.    BP (!) 151/64   Pulse 84   Temp 98.1 F (36.7 C)   Resp 20   Ht 5' 10.5" (1.791 m)   Wt 170 lb (77.1 kg)   SpO2 91%   BMI 24.05 kg/m  Wt Readings from Last 3 Encounters:  08/18/20 170 lb (77.1 kg)  07/15/20 175 lb (79.4 kg)  07/02/20 178 lb (80.7 kg)    Health Maintenance Due  Topic Date Due   COVID-19 Vaccine (1) Never done   FOOT EXAM  Never done   OPHTHALMOLOGY EXAM  Never done   TETANUS/TDAP  Never done   Zoster Vaccines- Shingrix (1 of 2) Never done   PNA vac Low Risk Adult (1 of 2 - PCV13) Never done   HEMOGLOBIN A1C  02/26/2020    There are no preventive care reminders to display for this patient.   Lab Results  Component Value Date   TSH 1.156 03/14/2010   Lab Results  Component Value Date   WBC 4.0 08/22/2019   HGB 15.4 08/22/2019   HCT 46.6 08/22/2019   MCV 93.4 08/22/2019   PLT 194 08/22/2019   Lab Results  Component Value Date   NA 138 08/22/2019   K 4.1 08/22/2019   CO2 31 08/22/2019   GLUCOSE 97 08/22/2019   BUN 12 08/22/2019   CREATININE 1.16 08/22/2019   BILITOT 1.0 05/22/2019   ALKPHOS 54 03/14/2010   AST 10 05/22/2019   ALT 5 (L) 05/22/2019   PROT 7.0 05/22/2019   ALBUMIN 2.5 (L) 03/14/2010   CALCIUM 9.1 08/22/2019   ANIONGAP 9 08/22/2019   Lab Results  Component Value Date   CHOL 210 (H) 05/22/2019   Lab Results  Component Value Date    HDL 33 (L) 05/22/2019   Lab Results  Component Value Date   LDLCALC 147 (H) 05/22/2019   Lab Results  Component Value Date   TRIG 162 (H) 05/22/2019   Lab Results  Component Value Date   CHOLHDL 6.4 (H) 05/22/2019   Lab Results  Component Value Date   HGBA1C 6.7 (A) 08/27/2019   HGBA1C 6.7 08/27/2019   HGBA1C 6.7 (A) 08/27/2019   HGBA1C 6.7 08/27/2019       Assessment & Plan:   Problem List Items Addressed This Visit       Cardiovascular and Mediastinum   Essential hypertension - Primary    BP Readings from Last 3 Encounters:  08/18/20 (!) 151/64  07/15/20 (!) 149/75  07/02/20 (!) 197/81  -continue current meds; don't want to make adjustment without renal fcn data -he smoked prior to coming in, and I don't want him having adverse med effects that would increase fall risk        Relevant Orders   CBC with Differential/Platelet   CMP14+EGFR   Lipid Panel With LDL/HDL Ratio     Endocrine   Type 2 diabetes mellitus without complication, without long-term current use of insulin (HCC)    -checking A1c today       Relevant Orders   Lipid Panel With LDL/HDL Ratio   Hemoglobin A1c     Other   HLD (hyperlipidemia)    -checking labs today       Relevant Orders   Lipid Panel With LDL/HDL Ratio     No orders of the defined types were placed in this encounter.    Noreene Larsson, NP

## 2020-08-18 NOTE — Assessment & Plan Note (Signed)
-  checking labs today 

## 2020-08-19 ENCOUNTER — Other Ambulatory Visit: Payer: Self-pay | Admitting: Nurse Practitioner

## 2020-08-19 DIAGNOSIS — E119 Type 2 diabetes mellitus without complications: Secondary | ICD-10-CM

## 2020-08-19 LAB — CBC WITH DIFFERENTIAL/PLATELET
Basophils Absolute: 0.1 x10E3/uL (ref 0.0–0.2)
Basos: 1 %
EOS (ABSOLUTE): 0.1 x10E3/uL (ref 0.0–0.4)
Eos: 4 %
Hematocrit: 45 % (ref 37.5–51.0)
Hemoglobin: 14.5 g/dL (ref 13.0–17.7)
Immature Grans (Abs): 0 x10E3/uL (ref 0.0–0.1)
Immature Granulocytes: 0 %
Lymphocytes Absolute: 1.6 x10E3/uL (ref 0.7–3.1)
Lymphs: 42 %
MCH: 29.5 pg (ref 26.6–33.0)
MCHC: 32.2 g/dL (ref 31.5–35.7)
MCV: 92 fL (ref 79–97)
Monocytes Absolute: 0.4 x10E3/uL (ref 0.1–0.9)
Monocytes: 11 %
Neutrophils Absolute: 1.6 x10E3/uL (ref 1.4–7.0)
Neutrophils: 42 %
Platelets: 213 x10E3/uL (ref 150–450)
RBC: 4.92 x10E6/uL (ref 4.14–5.80)
RDW: 12.7 % (ref 11.6–15.4)
WBC: 3.8 x10E3/uL (ref 3.4–10.8)

## 2020-08-19 LAB — LIPID PANEL WITH LDL/HDL RATIO
Cholesterol, Total: 188 mg/dL (ref 100–199)
HDL: 34 mg/dL — ABNORMAL LOW
LDL Chol Calc (NIH): 135 mg/dL — ABNORMAL HIGH (ref 0–99)
LDL/HDL Ratio: 4 ratio — ABNORMAL HIGH (ref 0.0–3.6)
Triglycerides: 103 mg/dL (ref 0–149)
VLDL Cholesterol Cal: 19 mg/dL (ref 5–40)

## 2020-08-19 LAB — HEMOGLOBIN A1C
Est. average glucose Bld gHb Est-mCnc: 166 mg/dL
Hgb A1c MFr Bld: 7.4 % — ABNORMAL HIGH (ref 4.8–5.6)

## 2020-08-19 LAB — CMP14+EGFR
ALT: 2 IU/L (ref 0–44)
AST: 11 IU/L (ref 0–40)
Albumin/Globulin Ratio: 1.4 (ref 1.2–2.2)
Albumin: 4.2 g/dL (ref 3.6–4.6)
Alkaline Phosphatase: 88 IU/L (ref 44–121)
BUN/Creatinine Ratio: 12 (ref 10–24)
BUN: 11 mg/dL (ref 8–27)
Bilirubin Total: 0.7 mg/dL (ref 0.0–1.2)
CO2: 26 mmol/L (ref 20–29)
Calcium: 9.7 mg/dL (ref 8.6–10.2)
Chloride: 103 mmol/L (ref 96–106)
Creatinine, Ser: 0.94 mg/dL (ref 0.76–1.27)
Globulin, Total: 3 g/dL (ref 1.5–4.5)
Glucose: 174 mg/dL — ABNORMAL HIGH (ref 65–99)
Potassium: 4 mmol/L (ref 3.5–5.2)
Sodium: 145 mmol/L — ABNORMAL HIGH (ref 134–144)
Total Protein: 7.2 g/dL (ref 6.0–8.5)
eGFR: 80 mL/min/1.73

## 2020-08-19 MED ORDER — METFORMIN HCL 500 MG PO TABS: 500.0000 mg | ORAL_TABLET | Freq: Two times a day (BID) | ORAL | 3 refills | Status: DC

## 2020-08-19 MED ORDER — ROSUVASTATIN CALCIUM 20 MG PO TABS
20.0000 mg | ORAL_TABLET | Freq: Every day | ORAL | 3 refills | Status: DC
Start: 2020-08-19 — End: 2021-10-19

## 2020-08-19 NOTE — Progress Notes (Signed)
I increased his rosuvastatin because his cholesterol is high.  I sent in metformin because is A1c is 7.4, which is higher than 7. Try sutting back on sugary foods.

## 2020-08-19 NOTE — Assessment & Plan Note (Signed)
Lab Results  Component Value Date   HGBA1C 7.4 (H) 08/18/2020   -goal < 8.0 -will sn

## 2020-08-19 NOTE — Progress Notes (Signed)
-  INCREASE rosuvastatin to 20 mg -Rx. metformin

## 2020-12-17 ENCOUNTER — Ambulatory Visit: Payer: Medicare HMO | Admitting: Family Medicine

## 2020-12-17 ENCOUNTER — Encounter: Payer: Self-pay | Admitting: Internal Medicine

## 2020-12-17 ENCOUNTER — Ambulatory Visit (INDEPENDENT_AMBULATORY_CARE_PROVIDER_SITE_OTHER): Payer: Medicare HMO | Admitting: Internal Medicine

## 2020-12-17 ENCOUNTER — Other Ambulatory Visit: Payer: Self-pay

## 2020-12-17 VITALS — BP 146/77 | HR 86 | Temp 97.9°F | Resp 18 | Ht 70.0 in | Wt 167.1 lb

## 2020-12-17 DIAGNOSIS — E1169 Type 2 diabetes mellitus with other specified complication: Secondary | ICD-10-CM

## 2020-12-17 DIAGNOSIS — Z2821 Immunization not carried out because of patient refusal: Secondary | ICD-10-CM | POA: Diagnosis not present

## 2020-12-17 DIAGNOSIS — E782 Mixed hyperlipidemia: Secondary | ICD-10-CM

## 2020-12-17 DIAGNOSIS — B351 Tinea unguium: Secondary | ICD-10-CM | POA: Insufficient documentation

## 2020-12-17 DIAGNOSIS — I10 Essential (primary) hypertension: Secondary | ICD-10-CM

## 2020-12-17 DIAGNOSIS — F17219 Nicotine dependence, cigarettes, with unspecified nicotine-induced disorders: Secondary | ICD-10-CM

## 2020-12-17 NOTE — Assessment & Plan Note (Signed)
Smokes about half pack per day Does not want to quit or cut down

## 2020-12-17 NOTE — Assessment & Plan Note (Signed)
Lab Results  Component Value Date   HGBA1C 7.4 (H) 08/18/2020   On metformin Advised to follow diabetic diet On statin and ARB F/u CMP and lipid panel Diabetic foot exam: Today Diabetic eye exam: Advised to follow up with Ophthalmology for diabetic eye exam

## 2020-12-17 NOTE — Assessment & Plan Note (Signed)
BP Readings from Last 1 Encounters:  12/17/20 (!) 146/77   Elevated today, could be due to stress at home today Overall well-controlled with Azor 10-40 mg QD Counseled for compliance with the medications Advised DASH diet and moderate exercise/walking, at least 150 mins/week

## 2020-12-17 NOTE — Patient Instructions (Addendum)
Please continue taking medications as prescribed.  Please get fasting blood tests done before the next visit.  Please use Lamisil cream to toenail area.

## 2020-12-17 NOTE — Progress Notes (Signed)
Established Patient Office Visit  Subjective:  Patient ID: Jerry West, male    DOB: [birth year]  Age: 86 y.o. MRN: [id number]  CC:  Chief Complaint  Patient presents with   Follow-up    4 month follow up for bp check     HPI Jerry West is a 86 y.o. male with PMH of HTN, type 2 DM and HLD who presents for f/u of his chronic medical conditions.  HTN: His BP was elevated in the office today.  He takes his medications regularly.  He states that he was stressed this morning as he had some argument with his wife.  He denies any headache, dizziness, chest pain, dyspnea or palpitations.  Type II DM: He takes metformin for it.  Denies any fatigue, polyuria or polydipsia.  He is HbA1c was 7.4 in 08/2020.  HLD: Takes Crestor.  He denies taking any vaccine.  Past Medical History:  Diagnosis Date   Alcoholism in remission ([facility]) 12/19/2016   Cancer ([facility])    prostate   Cataract    Diabetes mellitus    Type II, denies being a diabetic at this time   Elevated blood sugar 12/19/2016   History of prostate cancer 12/19/2016   HLD (hyperlipidemia) 01/03/2017   Hypertension    Tobacco abuse 12/19/2016    Past Surgical History:  Procedure Laterality Date   APPENDECTOMY     CHOLECYSTECTOMY     FRACTURE SURGERY      Family History  Problem Relation Age of Onset   Stroke Mother    Alzheimer's disease Father    Alcohol abuse Father    Arthritis Father     Social History   Socioeconomic History   Marital status: Married    Spouse name: Talbert Forest   Number of children: 3   Years of education: 6   Highest education level: Not on file  Occupational History   Occupation: retired    Comment: farming, mill work  Tobacco Use   Smoking status: Every Day    Packs/day: 0.75    Types: Cigarettes    Start date: 03/06/1946   Smokeless tobacco: Never  Vaping Use   Vaping Use: Never used  Substance and Sexual Activity   Alcohol use: No    Comment: prior alcoholic  stopped in 1997   Drug use: No   Sexual activity: Not Currently  Other Topics Concern   Not on file  Social History Narrative   Lives with Talbert Forest 2nd wife    6th grade education   Reads "pretty good"   3 children    Close by       Enjoys: outside cutting grass        Diet: all food groups    Caffeine: 2 cups daily, sodas sometimes     Water: 6-8 cups daily       Wears seat belt   Smoke detectors    Does not use phone while driving            Social Determinants of Health   Financial Resource Strain: Low Risk    Difficulty of Paying Living Expenses: Not hard at all  Food Insecurity: No Food Insecurity   Worried About Programme researcher, broadcasting/film/video in the Last Year: Never true   Ran Out of Food in the Last Year: Never true  Transportation Needs: No Transportation Needs   Lack of Transportation (Medical): No   Lack of Transportation (Non-Medical): No  Physical Activity: Sufficiently Active  Days of Exercise per Week: 3 days   Minutes of Exercise per Session: 60 min  Stress: No Stress Concern Present   Feeling of Stress : Not at all  Social Connections: Moderately Integrated   Frequency of Communication with Friends and Family: More than three times a week   Frequency of Social Gatherings with Friends and Family: Twice a week   Attends Religious Services: More than 4 times per year   Active Member of [employer] or Organizations: No   Attends Music therapist: Never   Marital Status: Married  Human resources officer Violence: Not At Risk   Fear of Current or Ex-Partner: No   Emotionally Abused: No   Physically Abused: No   Sexually Abused: No    Outpatient Medications Prior to Visit  Medication Sig Dispense Refill   acetaminophen (TYLENOL) 500 MG tablet Take 1 tablet (500 mg total) by mouth every 6 (six) hours as needed. 30 tablet 0   amLODipine-olmesartan (AZOR) 10-40 MG tablet Take 1 tablet by mouth daily. 90 tablet 3   metFORMIN (GLUCOPHAGE) 500 MG tablet Take 1 tablet  (500 mg total) by mouth 2 (two) times daily with a meal. 180 tablet 3   rosuvastatin (CRESTOR) 20 MG tablet Take 1 tablet (20 mg total) by mouth daily. 90 tablet 3   No facility-administered medications prior to visit.    No Known Allergies  ROS Review of Systems  Constitutional:  Negative for chills and fever.  HENT:  Negative for congestion and sore throat.   Eyes:  Negative for pain and discharge.  Respiratory:  Negative for cough and shortness of breath.   Cardiovascular:  Negative for chest pain and palpitations.  Gastrointestinal:  Negative for constipation, diarrhea, nausea and vomiting.  Endocrine: Negative for polydipsia and polyuria.  Genitourinary:  Negative for dysuria and hematuria.  Musculoskeletal:  Negative for neck pain and neck stiffness.  Skin:  Negative for rash.  Neurological:  Negative for dizziness, weakness, numbness and headaches.  Psychiatric/Behavioral:  Negative for agitation and behavioral problems.      Objective:    Physical Exam Vitals reviewed.  Constitutional:      General: He is not in acute distress.    Appearance: He is not diaphoretic.  HENT:     Head: Normocephalic and atraumatic.     Nose: Nose normal.     Mouth/Throat:     Mouth: Mucous membranes are moist.  Eyes:     General: No scleral icterus.    Extraocular Movements: Extraocular movements intact.  Cardiovascular:     Rate and Rhythm: Normal rate and regular rhythm.     Pulses: Normal pulses.     Heart sounds: Normal heart sounds. No murmur heard. Pulmonary:     Breath sounds: Normal breath sounds. No wheezing or rales.  Abdominal:     Palpations: Abdomen is soft.     Tenderness: There is no abdominal tenderness.  Musculoskeletal:     Cervical back: Neck supple. No tenderness.     Right lower leg: No edema.     Left lower leg: No edema.  Skin:    General: Skin is warm.     Findings: No rash.  Neurological:     General: No focal deficit present.     Mental Status: He  is alert and oriented to person, place, and time.     Sensory: No sensory deficit.     Motor: No weakness.  Psychiatric:        Mood  and Affect: Mood normal.        Behavior: Behavior normal.    BP (!) 146/77 (BP Location: Left Arm, Patient Position: Sitting, Cuff Size: Normal)   Pulse 86   Temp 97.9 F (36.6 C) (Oral)   Resp 18   Ht 5\' 10"  (1.778 m)   Wt 167 lb 1.9 oz (75.8 kg)   SpO2 97%   BMI 23.98 kg/m  Wt Readings from Last 3 Encounters:  12/17/20 167 lb 1.9 oz (75.8 kg)  08/18/20 170 lb (77.1 kg)  07/15/20 175 lb (79.4 kg)     Health Maintenance Due  Topic Date Due   COVID-19 Vaccine (1) Never done   OPHTHALMOLOGY EXAM  Never done   TETANUS/TDAP  Never done   Zoster Vaccines- Shingrix (1 of 2) Never done   INFLUENZA VACCINE  Never done    There are no preventive care reminders to display for this patient.  Lab Results  Component Value Date   TSH 1.156 03/14/2010   Lab Results  Component Value Date   WBC 3.8 08/18/2020   HGB 14.5 08/18/2020   HCT 45.0 08/18/2020   MCV 92 08/18/2020   PLT 213 08/18/2020   Lab Results  Component Value Date   NA 145 (H) 08/18/2020   K 4.0 08/18/2020   CO2 26 08/18/2020   GLUCOSE 174 (H) 08/18/2020   BUN 11 08/18/2020   CREATININE 0.94 08/18/2020   BILITOT 0.7 08/18/2020   ALKPHOS 88 08/18/2020   AST 11 08/18/2020   ALT 2 08/18/2020   PROT 7.2 08/18/2020   ALBUMIN 4.2 08/18/2020   CALCIUM 9.7 08/18/2020   ANIONGAP 9 08/22/2019   EGFR 80 08/18/2020   Lab Results  Component Value Date   CHOL 188 08/18/2020   Lab Results  Component Value Date   HDL 34 (L) 08/18/2020   Lab Results  Component Value Date   LDLCALC 135 (H) 08/18/2020   Lab Results  Component Value Date   TRIG 103 08/18/2020   Lab Results  Component Value Date   CHOLHDL 6.4 (H) 05/22/2019   Lab Results  Component Value Date   HGBA1C 7.4 (H) 08/18/2020      Assessment & Plan:   Problem List Items Addressed This Visit        Cardiovascular and Mediastinum   Essential hypertension - Primary    BP Readings from Last 1 Encounters:  12/17/20 (!) 146/77  Elevated today, could be due to stress at home today Overall well-controlled with Azor 10-40 mg QD Counseled for compliance with the medications Advised DASH diet and moderate exercise/walking, at least 150 mins/week       Relevant Orders   CBC with Differential/Platelet   CMP14+EGFR   TSH     Endocrine   Type 2 diabetes mellitus with other specified complication (HCC)    Lab Results  Component Value Date   HGBA1C 7.4 (H) 08/18/2020  On metformin Advised to follow diabetic diet On statin and ARB F/u CMP and lipid panel Diabetic foot exam: Today Diabetic eye exam: Advised to follow up with Ophthalmology for diabetic eye exam       Relevant Orders   CMP14+EGFR   HgB A1c   Urinalysis     Nervous and Auditory   Cigarette nicotine dependence with nicotine-induced disorder    Smokes about half pack per day Does not want to quit or cut down        Musculoskeletal and Integument   Onychomycosis  Noted on diabetic foot exam Advised to use Lamisil cream for now Advised to keep feet clean and dry        Other   HLD (hyperlipidemia)    On Crestor Check lipid profile      Other Visit Diagnoses     Refused influenza vaccine           No orders of the defined types were placed in this encounter.   Follow-up: Return in about 4 months (around 04/19/2021) for Annual physical.    Lindell Spar, MD

## 2020-12-17 NOTE — Assessment & Plan Note (Signed)
On Crestor Check lipid profile 

## 2020-12-17 NOTE — Assessment & Plan Note (Signed)
Noted on diabetic foot exam Advised to use Lamisil cream for now Advised to keep feet clean and dry

## 2020-12-30 ENCOUNTER — Ambulatory Visit: Payer: Medicare HMO

## 2021-01-07 ENCOUNTER — Other Ambulatory Visit: Payer: Self-pay

## 2021-01-07 ENCOUNTER — Ambulatory Visit (INDEPENDENT_AMBULATORY_CARE_PROVIDER_SITE_OTHER): Payer: Medicare HMO | Admitting: *Deleted

## 2021-01-07 DIAGNOSIS — Z0000 Encounter for general adult medical examination without abnormal findings: Secondary | ICD-10-CM | POA: Diagnosis not present

## 2021-01-07 NOTE — Progress Notes (Signed)
Subjective:   Jerry West is a 86 y.o. male who presents for Medicare Annual/Subsequent preventive examination. I connected with  Luiz Blare on 01/07/21 by audio enabled telemedicine application and verified that I am speaking with the correct person using two identifiers.   I discussed the limitations of evaluation and management by telemedicine. The patient expressed understanding and agreed to proceed.   Review of Systems          Objective:    There were no vitals filed for this visit. There is no height or weight on file to calculate BMI.  Advanced Directives 01/07/2020 08/22/2019 07/14/2019 03/30/2018 10/05/2016 09/27/2016  Does Patient Have a Medical Advance Directive? No No No No No No  Would patient like information on creating a medical advance directive? No - Patient declined - - No - Patient declined - -    Current Medications (verified) Outpatient Encounter Medications as of 01/07/2021  Medication Sig   acetaminophen (TYLENOL) 500 MG tablet Take 1 tablet (500 mg total) by mouth every 6 (six) hours as needed.   amLODipine-olmesartan (AZOR) 10-40 MG tablet Take 1 tablet by mouth daily.   metFORMIN (GLUCOPHAGE) 500 MG tablet Take 1 tablet (500 mg total) by mouth 2 (two) times daily with a meal.   rosuvastatin (CRESTOR) 20 MG tablet Take 1 tablet (20 mg total) by mouth daily.   No facility-administered encounter medications on file as of 01/07/2021.    Allergies (verified) Patient has no known allergies.   History: Past Medical History:  Diagnosis Date   Alcoholism in remission ([place]) 12/19/2016   Cancer ([place])    prostate   Cataract    Diabetes mellitus    Type II, denies being a diabetic at this time   Elevated blood sugar 12/19/2016   History of prostate cancer 12/19/2016   HLD (hyperlipidemia) 01/03/2017   Hypertension    Tobacco abuse 12/19/2016   Past Surgical History:  Procedure Laterality Date   APPENDECTOMY     CHOLECYSTECTOMY      FRACTURE SURGERY     Family History  Problem Relation Age of Onset   Stroke Mother    Alzheimer's disease Father    Alcohol abuse Father    Arthritis Father    Social History   Socioeconomic History   Marital status: Married    Spouse name: Enid Derry   Number of children: 3   Years of education: 6   Highest education level: Not on file  Occupational History   Occupation: retired    Comment: farming, [place]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  Tobacco Use   Smoking status: Every Day    Packs/day: 0.75    Types: Cigarettes    Start date: [birth year]/06/1946   Smokeless tobacco: Never  Vaping Use   Vaping Use: Never used  Substance and Sexual Activity   Alcohol use: No    Comment: prior alcoholic stopped in 4665   Drug use: No   Sexual activity: Not Currently  Other Topics Concern   Not on file  Social History Narrative   Lives with Enid Derry 2nd wife    6th grade education   Reads "pretty good"   3 children    Close by       Enjoys: outside cutting grass        Diet: all food groups    Caffeine: 2 cups daily, sodas sometimes     Water: 6-8 cups daily       Wears seat belt   Smoke detectors  Does not use phone while driving            Social Determinants of Health   Financial Resource Strain: Low Risk    Difficulty of Paying Living Expenses: Not hard at all  Food Insecurity: No Food Insecurity   Worried About Charity fundraiser in the Last Year: Never true   [place] in the Last Year: Never true  Transportation Needs: No Transportation Needs   Lack of Transportation (Medical): No   Lack of Transportation (Non-Medical): No  Physical Activity: Sufficiently Active   Days of Exercise per Week: 3 days   Minutes of Exercise per Session: 60 min  Stress: No Stress Concern Present   Feeling of Stress : Not at all  Social Connections: Moderately Integrated   Frequency of Communication with Friends and Family: More than three times a week   Frequency of Social Gatherings with Friends and  Family: Twice a week   Attends Religious Services: More than 4 times per year   Active Member of [employer] or Organizations: No   Attends Archivist Meetings: Never   Marital Status: Married    Tobacco Counseling Ready to quit: Not Answered Counseling given: Not Answered   Clinical Intake:                 Diabetic?Yes         Activities of Daily Living No flowsheet data found.  Patient Care Team: Lindell Spar, MD as PCP - General (Internal Medicine)  Indicate any recent Medical Services you may have received from other than [facility] providers in the past year (date may be approximate).     Assessment:   This is a routine wellness examination for Jerry West.  Hearing/Vision screen No results found.  Dietary issues and exercise activities discussed:     Goals Addressed   None   Depression Screen PHQ 2/9 Scores 12/17/2020 08/18/2020 07/15/2020 07/02/2020 06/17/2020 03/18/2020 01/07/2020  PHQ - 2 Score 0 0 0 0 0 0 0    Fall Risk Fall Risk  12/17/2020 08/18/2020 07/15/2020 07/02/2020 06/17/2020  Falls in the past year? 0 0 0 0 0  Number falls in past yr: 0 0 0 0 0  Injury with Fall? 0 0 0 0 0  Risk for fall due to : No Fall Risks No Fall Risks No Fall Risks No Fall Risks No Fall Risks  Follow up Falls evaluation completed Falls evaluation completed Falls evaluation completed Falls evaluation completed Falls evaluation completed    FALL RISK PREVENTION PERTAINING TO THE HOME:  Any stairs in or around the home? No  If so, are there any without handrails?  NA Home free of loose throw rugs in walkways, pet beds, electrical cords, etc? Yes  Adequate lighting in your home to reduce risk of falls? Yes   ASSISTIVE DEVICES UTILIZED TO PREVENT FALLS:  Life alert? No  Use of a cane, [place] or w/c? No  Grab bars in the bathroom? Yes  Shower chair or bench in shower? No  Elevated toilet seat or a handicapped toilet? No   TIMED UP AND GO:  Was the test  performed? No .  Length of time to ambulate 10 feet: NA sec.     Cognitive Function:     6CIT Screen 01/07/2020  What Year? 0 points  What month? 0 points  What time? 0 points  Count back from 20 0 points  Months in reverse 4 points  Repeat phrase  0 points  Total Score 4    Immunizations  There is no immunization history on file for this patient.  TDAP status: Due, Education has been provided regarding the importance of this vaccine. Advised may receive this vaccine at local pharmacy or Health Dept. Aware to provide a copy of the vaccination record if obtained from local pharmacy or Health Dept. Verbalized acceptance and understanding.  Flu Vaccine status: Declined, Education has been provided regarding the importance of this vaccine but patient still declined. Advised may receive this vaccine at local pharmacy or Health Dept. Aware to provide a copy of the vaccination record if obtained from local pharmacy or Health Dept. Verbalized acceptance and understanding.  Pneumococcal vaccine status: Declined,  Education has been provided regarding the importance of this vaccine but patient still declined. Advised may receive this vaccine at local pharmacy or Health Dept. Aware to provide a copy of the vaccination record if obtained from local pharmacy or Health Dept. Verbalized acceptance and understanding.   Covid-19 vaccine status: Declined, Education has been provided regarding the importance of this vaccine but patient still declined. Advised may receive this vaccine at local pharmacy or Health Dept.or vaccine clinic. Aware to provide a copy of the vaccination record if obtained from local pharmacy or Health Dept. Verbalized acceptance and understanding.  Qualifies for Shingles Vaccine? Yes   Zostavax completed No   Shingrix Completed?: No.    Education has been provided regarding the importance of this vaccine. Patient has been advised to call insurance company to determine out of pocket  expense if they have not yet received this vaccine. Advised may also receive vaccine at local pharmacy or Health Dept. Verbalized acceptance and understanding.  Screening Tests Health Maintenance  Topic Date Due   COVID-19 Vaccine (1) Never done   Pneumonia Vaccine 51+ Years old (1 - PCV) Never done   OPHTHALMOLOGY EXAM  Never done   TETANUS/TDAP  Never done   Zoster Vaccines- Shingrix (1 of 2) Never done   INFLUENZA VACCINE  Never done   HEMOGLOBIN A1C  02/17/2021   FOOT EXAM  12/17/2021   HPV VACCINES  Aged Out    Health Maintenance  Health Maintenance Due  Topic Date Due   COVID-19 Vaccine (1) Never done   Pneumonia Vaccine 65+ Years old (1 - PCV) Never done   OPHTHALMOLOGY EXAM  Never done   TETANUS/TDAP  Never done   Zoster Vaccines- Shingrix (1 of 2) Never done   INFLUENZA VACCINE  Never done    Colorectal cancer screening: No longer required.   Lung Cancer Screening: (Low Dose CT Chest recommended if Age 71-80 years, 30 pack-year currently smoking OR have quit w/in 15years.) does not qualify.   Lung Cancer Screening Referral: NA  Additional Screening:  Hepatitis C Screening: does not qualify; Completed NA  Vision Screening: Recommended annual ophthalmology exams for early detection of glaucoma and other disorders of the eye. Is the patient up to date with their annual eye exam?  No  Who is the provider or what is the name of the office in which the patient attends annual eye exams? NA If pt is not established with a provider, would they like to be referred to a provider to establish care? No .   Dental Screening: Recommended annual dental exams for proper oral hygiene  Community Resource Referral / Chronic Care Management: CRR required this visit?  No   CCM required this visit?  No      Plan:  I have personally reviewed and noted the following in the patient's chart:   Medical and social history Use of alcohol, tobacco or illicit drugs  Current  medications and supplements including opioid prescriptions. Patient is not currently taking opioid prescriptions. Functional ability and status Nutritional status Physical activity Advanced directives List of other physicians Hospitalizations, surgeries, and ER visits in previous 12 months Vitals Screenings to include cognitive, depression, and falls Referrals and appointments  In addition, I have reviewed and discussed with patient certain preventive protocols, quality metrics, and best practice recommendations. A written personalized care plan for preventive services as well as general preventive health recommendations were provided to patient.     Danella Penton, [place]   01/07/2021   Nurse Notes: This was a telehealth visit. The patient was at home, the provider was in the office. Ihor Dow, MD

## 2021-01-07 NOTE — Patient Instructions (Signed)
Jerry West , Thank you for taking time to come for your Medicare Wellness Visit. I appreciate your ongoing commitment to your health goals. Please review the following plan we discussed and let me know if I can assist you in the future.   Screening recommendations/referrals: Colonoscopy: No longer required Recommended yearly ophthalmology/optometry visit for glaucoma screening and checkup Recommended yearly dental visit for hygiene and checkup  Vaccinations: Influenza vaccine: Patient Declined Pneumococcal vaccine: Patient Declined Tdap vaccine: Patient Declined Shingles vaccine: Patient Declined    Advanced directives: Information discussed  Conditions/risks identified: Diabetes, Hypertension  Next appointment: 1 year  Preventive Care 58 Years and Older, Male Preventive care refers to lifestyle choices and visits with your health care provider that can promote health and wellness. What does preventive care include? A yearly physical exam. This is also called an annual well check. Dental exams once or twice a year. Routine eye exams. Ask your health care provider how often you should have your eyes checked. Personal lifestyle choices, including: Daily care of your teeth and gums. Regular physical activity. Eating a healthy diet. Avoiding tobacco and drug use. Limiting alcohol use. Practicing safe sex. Taking low doses of aspirin every day. Taking vitamin and mineral supplements as recommended by your health care provider. What happens during an annual well check? The services and screenings done by your health care provider during your annual well check will depend on your age, overall health, lifestyle risk factors, and family history of disease. Counseling  Your health care provider may ask you questions about your: Alcohol use. Tobacco use. Drug use. Emotional well-being. Home and relationship well-being. Sexual activity. Eating habits. History of falls. Memory  and ability to understand (cognition). Work and work Statistician. Screening  You may have the following tests or measurements: Height, weight, and BMI. Blood pressure. Lipid and cholesterol levels. These may be checked every 5 years, or more frequently if you are over 7 years old. Skin check. Lung cancer screening. You may have this screening every year starting at age 48 if you have a 30-pack-year history of smoking and currently smoke or have quit within the past 15 years. Fecal occult blood test (FOBT) of the stool. You may have this test every year starting at age 19. Flexible sigmoidoscopy or colonoscopy. You may have a sigmoidoscopy every 5 years or a colonoscopy every 10 years starting at age 61. Prostate cancer screening. Recommendations will vary depending on your family history and other risks. Hepatitis C blood test. Hepatitis B blood test. Sexually transmitted disease (STD) testing. Diabetes screening. This is done by checking your blood sugar (glucose) after you have not eaten for a while (fasting). You may have this done every 1-3 years. Abdominal aortic aneurysm (AAA) screening. You may need this if you are a current or former smoker. Osteoporosis. You may be screened starting at age 89 if you are at high risk. Talk with your health care provider about your test results, treatment options, and if necessary, the need for more tests. Vaccines  Your health care provider may recommend certain vaccines, such as: Influenza vaccine. This is recommended every year. Tetanus, diphtheria, and acellular pertussis (Tdap, Td) vaccine. You may need a Td booster every 10 years. Zoster vaccine. You may need this after age 57. Pneumococcal 13-valent conjugate (PCV13) vaccine. One dose is recommended after age 34. Pneumococcal polysaccharide (PPSV23) vaccine. One dose is recommended after age 35. Talk to your health care provider about which screenings and vaccines you need and how  often you  need them. This information is not intended to replace advice given to you by your health care provider. Make sure you discuss any questions you have with your health care provider. Document Released: 03/19/2015 Document Revised: 11/10/2015 Document Reviewed: 12/22/2014 Elsevier Interactive Patient Education  2017 [place] Prevention in the Home Falls can cause injuries. They can happen to people of all ages. There are many things you can do to make your home safe and to help prevent falls. What can I do on the outside of my home? Regularly fix the edges of walkways and driveways and fix any cracks. Remove anything that might make you trip as you walk through a door, such as a raised step or threshold. Trim any bushes or trees on the path to your home. Use bright outdoor lighting. Clear any walking paths of anything that might make someone trip, such as rocks or tools. Regularly check to see if handrails are loose or broken. Make sure that both sides of any steps have handrails. Any raised decks and porches should have guardrails on the edges. Have any leaves, snow, or ice cleared regularly. Use sand or salt on walking paths during winter. Clean up any spills in your garage right away. This includes oil or grease spills. What can I do in the bathroom? Use night lights. Install grab bars by the toilet and in the tub and shower. Do not use towel bars as grab bars. Use non-skid mats or decals in the tub or shower. If you need to sit down in the shower, use a plastic, non-slip stool. Keep the floor dry. Clean up any water that spills on the floor as soon as it happens. Remove soap buildup in the tub or shower regularly. Attach bath mats securely with double-sided non-slip rug tape. Do not have throw rugs and other things on the floor that can make you trip. What can I do in the bedroom? Use night lights. Make sure that you have a light by your bed that is easy to reach. Do not  use any sheets or blankets that are too big for your bed. They should not hang down onto the floor. Have a firm chair that has side arms. You can use this for support while you get dressed. Do not have throw rugs and other things on the floor that can make you trip. What can I do in the kitchen? Clean up any spills right away. Avoid walking on wet floors. Keep items that you use a lot in easy-to-reach places. If you need to reach something above you, use a strong step stool that has a grab bar. Keep electrical cords out of the way. Do not use floor polish or wax that makes floors slippery. If you must use wax, use non-skid floor wax. Do not have throw rugs and other things on the floor that can make you trip. What can I do with my stairs? Do not leave any items on the stairs. Make sure that there are handrails on both sides of the stairs and use them. Fix handrails that are broken or loose. Make sure that handrails are as long as the stairways. Check any carpeting to make sure that it is firmly attached to the stairs. Fix any carpet that is loose or worn. Avoid having throw rugs at the top or bottom of the stairs. If you do have throw rugs, attach them to the floor with carpet tape. Make sure that you have a  light switch at the top of the stairs and the bottom of the stairs. If you do not have them, ask someone to add them for you. What else can I do to help prevent falls? Wear shoes that: Do not have high heels. Have rubber bottoms. Are comfortable and fit you well. Are closed at the toe. Do not wear sandals. If you use a stepladder: Make sure that it is fully opened. Do not climb a closed stepladder. Make sure that both sides of the stepladder are locked into place. Ask someone to hold it for you, if possible. Clearly mark and make sure that you can see: Any grab bars or handrails. First and last steps. Where the edge of each step is. Use tools that help you move around (mobility  aids) if they are needed. These include: Canes. Walkers. Scooters. Crutches. Turn on the lights when you go into a dark area. Replace any light bulbs as soon as they burn out. Set up your furniture so you have a clear path. Avoid moving your furniture around. If any of your floors are uneven, fix them. If there are any pets around you, be aware of where they are. Review your medicines with your doctor. Some medicines can make you feel dizzy. This can increase your chance of falling. Ask your doctor what other things that you can do to help prevent falls. This information is not intended to replace advice given to you by your health care provider. Make sure you discuss any questions you have with your health care provider. Document Released: 12/17/2008 Document Revised: 07/29/2015 Document Reviewed: 03/27/2014 Elsevier Interactive Patient Education  2017 [employer].

## 2021-01-26 ENCOUNTER — Ambulatory Visit: Payer: Medicare HMO

## 2021-03-17 ENCOUNTER — Encounter: Payer: Self-pay | Admitting: *Deleted

## 2021-03-17 DIAGNOSIS — Z2821 Immunization not carried out because of patient refusal: Secondary | ICD-10-CM | POA: Insufficient documentation
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
## 2021-04-21 ENCOUNTER — Ambulatory Visit (INDEPENDENT_AMBULATORY_CARE_PROVIDER_SITE_OTHER): Payer: Medicare HMO | Admitting: Internal Medicine

## 2021-04-21 ENCOUNTER — Encounter: Payer: Self-pay | Admitting: Internal Medicine

## 2021-04-21 ENCOUNTER — Other Ambulatory Visit: Payer: Self-pay

## 2021-04-21 VITALS — BP 132/84 | HR 76 | Resp 18 | Ht 70.5 in | Wt 171.0 lb

## 2021-04-21 DIAGNOSIS — Z2821 Immunization not carried out because of patient refusal: Secondary | ICD-10-CM | POA: Diagnosis not present

## 2021-04-21 DIAGNOSIS — E785 Hyperlipidemia, unspecified: Secondary | ICD-10-CM | POA: Diagnosis not present

## 2021-04-21 DIAGNOSIS — N529 Male erectile dysfunction, unspecified: Secondary | ICD-10-CM | POA: Diagnosis not present

## 2021-04-21 DIAGNOSIS — E559 Vitamin D deficiency, unspecified: Secondary | ICD-10-CM

## 2021-04-21 DIAGNOSIS — I10 Essential (primary) hypertension: Secondary | ICD-10-CM | POA: Diagnosis not present

## 2021-04-21 DIAGNOSIS — Z0001 Encounter for general adult medical examination with abnormal findings: Secondary | ICD-10-CM | POA: Insufficient documentation

## 2021-04-21 DIAGNOSIS — E1169 Type 2 diabetes mellitus with other specified complication: Secondary | ICD-10-CM | POA: Diagnosis not present

## 2021-04-21 MED ORDER — SILDENAFIL CITRATE 25 MG PO TABS: 25.0000 mg | ORAL_TABLET | Freq: Every day | ORAL | 2 refills | Status: DC | PRN

## 2021-04-21 MED ORDER — SILDENAFIL CITRATE 25 MG PO TABS: 25.0000 mg | ORAL_TABLET | Freq: Every day | ORAL | 2 refills | Status: AC | PRN

## 2021-04-21 NOTE — Progress Notes (Signed)
Established Patient Office Visit  Subjective:  Patient ID: Jerry West, male    DOB: 1934-09-18  Age: 87 y.o. MRN: [id number]  CC:  Chief Complaint  Patient presents with   Annual Exam    Annual exam     HPI Jerry West is a 87 y.o. male with past medical history of HTN, type 2 DM and HLD who presents for annual physical.  He has been doing well overall.  BP is well-controlled. Takes medications regularly. Patient denies headache, dizziness, chest pain, dyspnea or palpitations.  He complains of erectile dysfunction.  Denies any dysuria, hematuria, urinary retention or hesitance, urethral discharge or pain.  He denies pneumococcal and Shingrix vaccines in the office today.   Past Medical History:  Diagnosis Date   Alcoholism in remission ([place]) 12/19/2016   Cancer ([place])    prostate   Cataract    Diabetes mellitus    Type II, denies being a diabetic at this time   Elevated blood sugar 12/19/2016   History of prostate cancer 12/19/2016   HLD (hyperlipidemia) 01/03/2017   Hypertension    Tobacco abuse 12/19/2016    Past Surgical History:  Procedure Laterality Date   APPENDECTOMY     CHOLECYSTECTOMY     FRACTURE SURGERY      Family History  Problem Relation Age of Onset   Stroke Mother    Alzheimer's disease Father    Alcohol abuse Father    Arthritis Father     Social History   Socioeconomic History   Marital status: Married    Spouse name: Enid Derry   Number of children: 3   Years of education: 6   Highest education level: Not on file  Occupational History   Occupation: retired    Comment: farming, [place]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  Tobacco Use   Smoking status: Every Day    Packs/day: 0.75    Types: Cigarettes    Start date: 03/06/1946   Smokeless tobacco: Never  Vaping Use   Vaping Use: Never used  Substance and Sexual Activity   Alcohol use: No    Comment: prior alcoholic stopped in 6659   Drug use: No   Sexual activity: Not Currently  Other  Topics Concern   Not on file  Social History Narrative   Lives with Enid Derry 2nd wife    6th grade education   Reads "pretty good"   3 children    Close by       Enjoys: outside cutting grass        Diet: all food groups    Caffeine: 2 cups daily, sodas sometimes     Water: 6-8 cups daily       Wears seat belt   Smoke detectors    Does not use phone while driving            Social Determinants of Health   Financial Resource Strain: Low Risk    Difficulty of Paying Living Expenses: Not hard at all  Food Insecurity: No Food Insecurity   Worried About Charity fundraiser in the Last Year: Never true   [place] in the Last Year: Never true  Transportation Needs: No Transportation Needs   Lack of Transportation (Medical): No   Lack of Transportation (Non-Medical): No  Physical Activity: Sufficiently Active   Days of Exercise per Week: 3 days   Minutes of Exercise per Session: 60 min  Stress: No Stress Concern Present   Feeling of Stress : Not  at all  Social Connections: Moderately Integrated   Frequency of Communication with Friends and Family: Twice a week   Frequency of Social Gatherings with Friends and Family: Twice a week   Attends Religious Services: 1 to 4 times per year   Active Member of [employer] or Organizations: No   Attends Music therapist: Never   Marital Status: Married  Human resources officer Violence: Not At Risk   Fear of Current or Ex-Partner: No   Emotionally Abused: No   Physically Abused: No   Sexually Abused: No    Outpatient Medications Prior to Visit  Medication Sig Dispense Refill   acetaminophen (TYLENOL) 500 MG tablet Take 1 tablet (500 mg total) by mouth every 6 (six) hours as needed. 30 tablet 0   amLODipine-olmesartan (AZOR) 10-40 MG tablet Take 1 tablet by mouth daily. 90 tablet 3   metFORMIN (GLUCOPHAGE) 500 MG tablet Take 1 tablet (500 mg total) by mouth 2 (two) times daily with a meal. 180 tablet 3   rosuvastatin  (CRESTOR) 20 MG tablet Take 1 tablet (20 mg total) by mouth daily. 90 tablet 3   No facility-administered medications prior to visit.    No Known Allergies  ROS Review of Systems  Constitutional:  Negative for chills and fever.  HENT:  Negative for congestion and sore throat.   Eyes:  Negative for pain and discharge.  Respiratory:  Negative for cough and shortness of breath.   Cardiovascular:  Negative for chest pain and palpitations.  Gastrointestinal:  Negative for constipation, diarrhea, nausea and vomiting.  Endocrine: Negative for polydipsia and polyuria.  Genitourinary:  Negative for dysuria and hematuria.  Musculoskeletal:  Negative for neck pain and neck stiffness.  Skin:  Negative for rash.  Neurological:  Negative for dizziness, weakness, numbness and headaches.  Psychiatric/Behavioral:  Negative for agitation and behavioral problems.      Objective:    Physical Exam Vitals reviewed.  Constitutional:      General: He is not in acute distress.    Appearance: He is not diaphoretic.  HENT:     Head: Normocephalic and atraumatic.     Nose: Nose normal.     Mouth/Throat:     Mouth: Mucous membranes are moist.  Eyes:     General: No scleral icterus.    Extraocular Movements: Extraocular movements intact.  Cardiovascular:     Rate and Rhythm: Normal rate and regular rhythm.     Pulses: Normal pulses.     Heart sounds: Normal heart sounds. No murmur heard. Pulmonary:     Breath sounds: Normal breath sounds. No wheezing or rales.  Abdominal:     Palpations: Abdomen is soft.     Tenderness: There is no abdominal tenderness.  Musculoskeletal:     Cervical back: Neck supple. No tenderness.     Right lower leg: No edema.     Left lower leg: No edema.  Skin:    General: Skin is warm.     Findings: No rash.  Neurological:     General: No focal deficit present.     Mental Status: He is alert and oriented to person, place, and time.     Cranial Nerves: No cranial  nerve deficit.     Sensory: No sensory deficit.     Motor: No weakness.  Psychiatric:        Mood and Affect: Mood normal.        Behavior: Behavior normal.    BP 132/84 (BP Location: Left  Arm, Patient Position: Sitting, Cuff Size: Normal)    Pulse 76    Resp 18    Ht 5' 10.5" (1.791 m)    Wt 171 lb 0.6 oz (77.6 kg)    SpO2 96%    BMI 24.19 kg/m  Wt Readings from Last 3 Encounters:  04/21/21 171 lb 0.6 oz (77.6 kg)  12/17/20 167 lb 1.9 oz (75.8 kg)  08/18/20 170 lb (77.1 kg)    Lab Results  Component Value Date   TSH 1.156 03/14/2010   Lab Results  Component Value Date   WBC 3.8 08/18/2020   HGB 14.5 08/18/2020   HCT 45.0 08/18/2020   MCV 92 08/18/2020   PLT 213 08/18/2020   Lab Results  Component Value Date   NA 145 (H) 08/18/2020   K 4.0 08/18/2020   CO2 26 08/18/2020   GLUCOSE 174 (H) 08/18/2020   BUN 11 08/18/2020   CREATININE 0.94 08/18/2020   BILITOT 0.7 08/18/2020   ALKPHOS 88 08/18/2020   AST 11 08/18/2020   ALT 2 08/18/2020   PROT 7.2 08/18/2020   ALBUMIN 4.2 08/18/2020   CALCIUM 9.7 08/18/2020   ANIONGAP 9 08/22/2019   EGFR 80 08/18/2020   Lab Results  Component Value Date   CHOL 188 08/18/2020   Lab Results  Component Value Date   HDL 34 (L) 08/18/2020   Lab Results  Component Value Date   LDLCALC 135 (H) 08/18/2020   Lab Results  Component Value Date   TRIG 103 08/18/2020   Lab Results  Component Value Date   CHOLHDL 6.4 (H) 05/22/2019   Lab Results  Component Value Date   HGBA1C 7.4 (H) 08/18/2020      Assessment & Plan:   Problem List Items Addressed This Visit       Cardiovascular and Mediastinum   Essential hypertension    BP Readings from Last 1 Encounters:  04/21/21 132/84  Well-controlled with Azor 10-40 mg QD Counseled for compliance with the medications Advised DASH diet and moderate exercise/walking, at least 150 mins/week       Relevant Medications   sildenafil (VIAGRA) 25 MG tablet     Endocrine   Type  2 diabetes mellitus with other specified complication (HCC)    Lab Results  Component Value Date   HGBA1C 7.4 (H) 08/18/2020  On metformin Advised to follow diabetic diet On statin and ARB F/u CMP and lipid panel Diabetic eye exam: Advised to follow up with Ophthalmology for diabetic eye exam      Relevant Orders   Hemoglobin A1c   CMP14+EGFR     Other   Erectile dysfunction    Sildenafil as needed      Relevant Medications   sildenafil (VIAGRA) 25 MG tablet   Encounter for general adult medical examination with abnormal findings - Primary    Physical exam as documented. Counseling done  re healthy lifestyle involving commitment to 150 minutes exercise per week, heart healthy diet, and attaining healthy weight.The importance of adequate sleep also discussed. Changes in health habits are decided on by the patient with goals and time frames  set for achieving them. Immunization and cancer screening needs are specifically addressed at this visit.      Relevant Orders   TSH   CMP14+EGFR   CBC with Differential/Platelet   Lipid Profile   Other Visit Diagnoses     Vitamin D deficiency       Relevant Orders   VITAMIN D 25 Hydroxy (Vit-D  Deficiency, Fractures)   Refused influenza vaccine       Refused pneumococcal vaccination           Meds ordered this encounter  Medications   DISCONTD: sildenafil (VIAGRA) 25 MG tablet    Sig: Take 1 tablet (25 mg total) by mouth daily as needed for erectile dysfunction.    Dispense:  10 tablet    Refill:  2   sildenafil (VIAGRA) 25 MG tablet    Sig: Take 1 tablet (25 mg total) by mouth daily as needed for erectile dysfunction.    Dispense:  10 tablet    Refill:  2    Follow-up: Return in about 6 months (around 10/19/2021) for DM and HTN.    Lindell Spar, MD

## 2021-04-21 NOTE — Patient Instructions (Signed)
Please continue taking medications as prescribed.  Please continue to follow low carb diet and ambulate as tolerated.  Please try to cut down -> quit smoking.

## 2021-04-21 NOTE — Assessment & Plan Note (Signed)

## 2021-04-21 NOTE — Assessment & Plan Note (Signed)
BP Readings from Last 1 Encounters:  04/21/21 132/84   Well-controlled with Azor 10-40 mg QD Counseled for compliance with the medications Advised DASH diet and moderate exercise/walking, at least 150 mins/week

## 2021-04-21 NOTE — Assessment & Plan Note (Signed)
Lab Results  Component Value Date   HGBA1C 7.4 (H) 08/18/2020   On metformin Advised to follow diabetic diet On statin and ARB F/u CMP and lipid panel Diabetic eye exam: Advised to follow up with Ophthalmology for diabetic eye exam

## 2021-04-21 NOTE — Assessment & Plan Note (Signed)
Sildenafil as needed

## 2021-04-22 ENCOUNTER — Encounter: Payer: Medicare HMO | Admitting: Internal Medicine

## 2021-04-22 LAB — CBC WITH DIFFERENTIAL/PLATELET
Basophils Absolute: 0 10*3/uL (ref 0.0–0.2)
Basos: 1 %
EOS (ABSOLUTE): 0.1 10*3/uL (ref 0.0–0.4)
Eos: 2 %
Hematocrit: 43.4 % (ref 37.5–51.0)
Hemoglobin: 14.5 g/dL (ref 13.0–17.7)
Immature Grans (Abs): 0 10*3/uL (ref 0.0–0.1)
Immature Granulocytes: 0 %
Lymphocytes Absolute: 1.7 10*3/uL (ref 0.7–3.1)
Lymphs: 34 %
MCH: 31 pg (ref 26.6–33.0)
MCHC: 33.4 g/dL (ref 31.5–35.7)
MCV: 93 fL (ref 79–97)
Monocytes Absolute: 0.4 10*3/uL (ref 0.1–0.9)
Monocytes: 7 %
Neutrophils Absolute: 2.8 10*3/uL (ref 1.4–7.0)
Neutrophils: 56 %
Platelets: 203 10*3/uL (ref 150–450)
RBC: 4.67 x10E6/uL (ref 4.14–5.80)
RDW: 12.9 % (ref 11.6–15.4)
WBC: 5 10*3/uL (ref 3.4–10.8)

## 2021-04-22 LAB — LIPID PANEL
Chol/HDL Ratio: 3.5 ratio (ref 0.0–5.0)
Cholesterol, Total: 122 mg/dL (ref 100–199)
HDL: 35 mg/dL — ABNORMAL LOW (ref >39)
LDL Chol Calc (NIH): 67 mg/dL (ref 0–99)
Triglycerides: 106 mg/dL (ref 0–149)
VLDL Cholesterol Cal: 20 mg/dL (ref 5–40)

## 2021-04-22 LAB — CMP14+EGFR
ALT: 5 IU/L (ref 0–44)
AST: 15 IU/L (ref 0–40)
Albumin/Globulin Ratio: 1.5 (ref 1.2–2.2)
Albumin: 4 g/dL (ref 3.6–4.6)
Alkaline Phosphatase: 77 IU/L (ref 44–121)
BUN/Creatinine Ratio: 8 — ABNORMAL LOW (ref 10–24)
BUN: 11 mg/dL (ref 8–27)
Bilirubin Total: 0.3 mg/dL (ref 0.0–1.2)
CO2: 27 mmol/L (ref 20–29)
Calcium: 9.3 mg/dL (ref 8.6–10.2)
Chloride: 103 mmol/L (ref 96–106)
Creatinine, Ser: 1.37 mg/dL — ABNORMAL HIGH (ref 0.76–1.27)
Globulin, Total: 2.6 g/dL (ref 1.5–4.5)
Glucose: 76 mg/dL (ref 70–99)
Potassium: 4 mmol/L (ref 3.5–5.2)
Sodium: 143 mmol/L (ref 134–144)
Total Protein: 6.6 g/dL (ref 6.0–8.5)
eGFR: 51 mL/min/{1.73_m2} — ABNORMAL LOW (ref >59)

## 2021-04-22 LAB — TSH: TSH: 1.17 u[IU]/mL (ref 0.450–4.500)

## 2021-04-22 LAB — HEMOGLOBIN A1C
Est. average glucose Bld gHb Est-mCnc: 154 mg/dL
Hgb A1c MFr Bld: 7 % — ABNORMAL HIGH (ref 4.8–5.6)

## 2021-04-22 LAB — VITAMIN D 25 HYDROXY (VIT D DEFICIENCY, FRACTURES): Vit D, 25-Hydroxy: 34.3 ng/mL (ref 30.0–100.0)

## 2021-05-03 ENCOUNTER — Ambulatory Visit: Payer: Medicare HMO

## 2021-05-31 ENCOUNTER — Ambulatory Visit: Payer: Medicare HMO

## 2021-10-19 ENCOUNTER — Encounter: Payer: Self-pay | Admitting: Internal Medicine

## 2021-10-19 ENCOUNTER — Ambulatory Visit (INDEPENDENT_AMBULATORY_CARE_PROVIDER_SITE_OTHER): Payer: Medicare HMO | Admitting: Internal Medicine

## 2021-10-19 VITALS — BP 136/62 | HR 83 | Resp 18 | Ht 70.0 in | Wt 166.6 lb

## 2021-10-19 DIAGNOSIS — F17219 Nicotine dependence, cigarettes, with unspecified nicotine-induced disorders: Secondary | ICD-10-CM | POA: Diagnosis not present

## 2021-10-19 DIAGNOSIS — E782 Mixed hyperlipidemia: Secondary | ICD-10-CM | POA: Diagnosis not present

## 2021-10-19 DIAGNOSIS — I10 Essential (primary) hypertension: Secondary | ICD-10-CM | POA: Diagnosis not present

## 2021-10-19 DIAGNOSIS — E1169 Type 2 diabetes mellitus with other specified complication: Secondary | ICD-10-CM | POA: Diagnosis not present

## 2021-10-19 MED ORDER — ROSUVASTATIN CALCIUM 20 MG PO TABS: 20.0000 mg | ORAL_TABLET | Freq: Every day | ORAL | 3 refills | Status: DC

## 2021-10-19 MED ORDER — AMLODIPINE-OLMESARTAN 10-40 MG PO TABS: 1.0000 | ORAL_TABLET | Freq: Every day | ORAL | 3 refills | Status: DC

## 2021-10-19 MED ORDER — METFORMIN HCL 500 MG PO TABS: 500.0000 mg | ORAL_TABLET | Freq: Two times a day (BID) | ORAL | 3 refills | Status: DC

## 2021-10-19 NOTE — Assessment & Plan Note (Signed)
BP Readings from Last 1 Encounters:  10/19/21 136/62   Well-controlled with Azor 10-40 mg QD Counseled for compliance with the medications Advised DASH diet and moderate exercise/walking, at least 150 mins/week

## 2021-10-19 NOTE — Assessment & Plan Note (Signed)
Lab Results  Component Value Date   HGBA1C 7.0 (H) 04/21/2021  Well-controlled On metformin Advised to follow diabetic diet On statin and ARB F/u CMP and lipid panel Diabetic eye exam: Advised to follow up with Ophthalmology for diabetic eye exam

## 2021-10-19 NOTE — Patient Instructions (Signed)
Please continue taking medications as prescribed.  Please continue to follow low carb diet and ambulate as tolerated.  Please consider getting Shingrix and Tdap vaccines at local pharmacy.

## 2021-10-19 NOTE — Assessment & Plan Note (Signed)
On Crestor Check lipid profile 

## 2021-10-19 NOTE — Progress Notes (Signed)
Established Patient Office Visit  Subjective:  Patient ID: Jerry West, male    DOB: Jul 02, 1934  Age: 87 y.o. MRN: [id number]  CC:  Chief Complaint  Patient presents with   Follow-up    Follow up HTN and DM     HPI MERLEN West is a 87 y.o. male with past medical history of HTN, type 2 DM and HLD who presents for f/u of his chronic medical conditions.  He has been doing well overall.   BP is well-controlled. Takes medications regularly. Patient denies headache, dizziness, chest pain, dyspnea or palpitations.  Type II DM: He takes metformin for it.  Denies any fatigue, polyuria or polydipsia.  He is HbA1c was 7.0 in 02/23.   HLD: Takes Crestor.   He denies taking any vaccine.  Past Medical History:  Diagnosis Date   Alcoholism in remission ([place]) 12/19/2016   Cancer ([place])    prostate   Cataract    Diabetes mellitus    Type II, denies being a diabetic at this time   Elevated blood sugar 12/19/2016   History of prostate cancer 12/19/2016   HLD (hyperlipidemia) 01/03/2017   Hypertension    Tobacco abuse 12/19/2016    Past Surgical History:  Procedure Laterality Date   APPENDECTOMY     CHOLECYSTECTOMY     FRACTURE SURGERY      Family History  Problem Relation Age of Onset   Stroke Mother    Alzheimer's disease Father    Alcohol abuse Father    Arthritis Father     Social History   Socioeconomic History   Marital status: Married    Spouse name: Enid Derry   Number of children: 3   Years of education: 6   Highest education level: Not on file  Occupational History   Occupation: retired    Comment: farming, [place]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  Tobacco Use   Smoking status: Every Day    Packs/day: 0.75    Types: Cigarettes    Start date: 03/06/1946   Smokeless tobacco: Never  Vaping Use   Vaping Use: Never used  Substance and Sexual Activity   Alcohol use: No    Comment: prior alcoholic stopped in 4128   Drug use: No   Sexual activity: Not Currently  Other  Topics Concern   Not on file  Social History Narrative   Lives with Enid Derry 2nd wife    6th grade education   Reads "pretty good"   3 children    Close by       Enjoys: outside cutting grass        Diet: all food groups    Caffeine: 2 cups daily, sodas sometimes     Water: 6-8 cups daily       Wears seat belt   Smoke detectors    Does not use phone while driving            Social Determinants of Health   Financial Resource Strain: Low Risk  (01/07/2021)   Overall Financial Resource Strain (CARDIA)    Difficulty of Paying Living Expenses: Not hard at all  Food Insecurity: No Food Insecurity (01/07/2021)   Hunger Vital Sign    Worried About Running Out of Food in the Last Year: Never true    [place] in the Last Year: Never true  Transportation Needs: No Transportation Needs (01/07/2021)   PRAPARE - Hydrologist (Medical): No    Lack of Transportation (Non-Medical):  No  Physical Activity: Sufficiently Active (01/07/2021)   Exercise Vital Sign    Days of Exercise per Week: 3 days    Minutes of Exercise per Session: 60 min  Stress: No Stress Concern Present (01/07/2021)   [place]    Feeling of Stress : Not at all  Social Connections: Moderately Integrated (01/07/2021)   Social Connection and Isolation Panel [NHANES]    Frequency of Communication with Friends and Family: Twice a week    Frequency of Social Gatherings with Friends and Family: Twice a week    Attends Religious Services: 1 to 4 times per year    Active Member of [employer] or Organizations: No    Attends Archivist Meetings: Never    Marital Status: Married  Human resources officer Violence: Not At Risk (01/07/2021)   Humiliation, Afraid, Rape, and Kick questionnaire    Fear of Current or Ex-Partner: No    Emotionally Abused: No    Physically Abused: No    Sexually Abused: No    Outpatient Medications Prior  to Visit  Medication Sig Dispense Refill   acetaminophen (TYLENOL) 500 MG tablet Take 1 tablet (500 mg total) by mouth every 6 (six) hours as needed. 30 tablet 0   sildenafil (VIAGRA) 25 MG tablet Take 1 tablet (25 mg total) by mouth daily as needed for erectile dysfunction. 10 tablet 2   amLODipine-olmesartan (AZOR) 10-40 MG tablet Take 1 tablet by mouth daily. 90 tablet 3   metFORMIN (GLUCOPHAGE) 500 MG tablet Take 1 tablet (500 mg total) by mouth 2 (two) times daily with a meal. 180 tablet 3   rosuvastatin (CRESTOR) 20 MG tablet Take 1 tablet (20 mg total) by mouth daily. 90 tablet 3   No facility-administered medications prior to visit.    No Known Allergies  ROS Review of Systems  Constitutional:  Negative for chills and fever.  HENT:  Negative for congestion and sore throat.   Eyes:  Negative for pain and discharge.  Respiratory:  Negative for cough and shortness of breath.   Cardiovascular:  Negative for chest pain and palpitations.  Gastrointestinal:  Negative for constipation, diarrhea, nausea and vomiting.  Endocrine: Negative for polydipsia and polyuria.  Genitourinary:  Negative for dysuria and hematuria.  Musculoskeletal:  Negative for neck pain and neck stiffness.  Skin:  Negative for rash.  Neurological:  Negative for dizziness, weakness, numbness and headaches.  Psychiatric/Behavioral:  Negative for agitation and behavioral problems.       Objective:    Physical Exam Vitals reviewed.  Constitutional:      General: He is not in acute distress.    Appearance: He is not diaphoretic.  HENT:     Head: Normocephalic and atraumatic.     Nose: Nose normal.     Mouth/Throat:     Mouth: Mucous membranes are moist.  Eyes:     General: No scleral icterus.    Extraocular Movements: Extraocular movements intact.  Cardiovascular:     Rate and Rhythm: Normal rate and regular rhythm.     Pulses: Normal pulses.     Heart sounds: Normal heart sounds. No murmur  heard. Pulmonary:     Breath sounds: Normal breath sounds. No wheezing or rales.  Musculoskeletal:     Cervical back: Neck supple. No tenderness.     Right lower leg: No edema.     Left lower leg: No edema.  Skin:    General: Skin is warm.  Findings: No rash.  Neurological:     General: No focal deficit present.     Mental Status: He is alert and oriented to person, place, and time.     Cranial Nerves: No cranial nerve deficit.     Sensory: No sensory deficit.     Motor: No weakness.  Psychiatric:        Mood and Affect: Mood normal.        Behavior: Behavior normal.     BP 136/62 (BP Location: Right Arm, Patient Position: Sitting, Cuff Size: Normal)   Pulse 83   Resp 18   Ht $R'5\' 10"'HN$  (1.778 m)   Wt 166 lb 9.6 oz (75.6 kg)   SpO2 95%   BMI 23.90 kg/m  Wt Readings from Last 3 Encounters:  10/19/21 166 lb 9.6 oz (75.6 kg)  04/21/21 171 lb 0.6 oz (77.6 kg)  12/17/20 167 lb 1.9 oz (75.8 kg)    Lab Results  Component Value Date   TSH 1.170 04/21/2021   Lab Results  Component Value Date   WBC 5.0 04/21/2021   HGB 14.5 04/21/2021   HCT 43.4 04/21/2021   MCV 93 04/21/2021   PLT 203 04/21/2021   Lab Results  Component Value Date   NA 143 04/21/2021   K 4.0 04/21/2021   CO2 27 04/21/2021   GLUCOSE 76 04/21/2021   BUN 11 04/21/2021   CREATININE 1.37 (H) 04/21/2021   BILITOT 0.3 04/21/2021   ALKPHOS 77 04/21/2021   AST 15 04/21/2021   ALT 5 04/21/2021   PROT 6.6 04/21/2021   ALBUMIN 4.0 04/21/2021   CALCIUM 9.3 04/21/2021   ANIONGAP 9 08/22/2019   EGFR 51 (L) 04/21/2021   Lab Results  Component Value Date   CHOL 122 04/21/2021   Lab Results  Component Value Date   HDL 35 (L) 04/21/2021   Lab Results  Component Value Date   LDLCALC 67 04/21/2021   Lab Results  Component Value Date   TRIG 106 04/21/2021   Lab Results  Component Value Date   CHOLHDL 3.5 04/21/2021   Lab Results  Component Value Date   HGBA1C 7.0 (H) 04/21/2021       Assessment & Plan:   Problem List Items Addressed This Visit       Cardiovascular and Mediastinum   Essential hypertension    BP Readings from Last 1 Encounters:  10/19/21 136/62  Well-controlled with Azor 10-40 mg QD Counseled for compliance with the medications Advised DASH diet and moderate exercise/walking, at least 150 mins/week      Relevant Medications   amLODipine-olmesartan (AZOR) 10-40 MG tablet   rosuvastatin (CRESTOR) 20 MG tablet     Endocrine   Type 2 diabetes mellitus with other specified complication (HCC) - Primary    Lab Results  Component Value Date   HGBA1C 7.0 (H) 04/21/2021  Well-controlled On metformin Advised to follow diabetic diet On statin and ARB F/u CMP and lipid panel Diabetic eye exam: Advised to follow up with Ophthalmology for diabetic eye exam      Relevant Medications   amLODipine-olmesartan (AZOR) 10-40 MG tablet   rosuvastatin (CRESTOR) 20 MG tablet   metFORMIN (GLUCOPHAGE) 500 MG tablet   Other Relevant Orders   Microalbumin / creatinine urine ratio   Basic Metabolic Panel (BMET)   Hemoglobin A1c     Nervous and Auditory   Cigarette nicotine dependence with nicotine-induced disorder    Smokes about 0.5 pack/day  Asked about quitting: confirms that he/she  currently smokes cigarettes Advise to quit smoking: Educated about QUITTING to reduce the risk of cancer, cardio and cerebrovascular disease. Assess willingness: Unwilling to quit at this time, but is working on cutting back. Assist with counseling and pharmacotherapy: Counseled for 5 minutes and literature provided. Arrange for follow up: follow up in 3 months and continue to offer help.        Other   HLD (hyperlipidemia)    On Crestor Check lipid profile      Relevant Medications   amLODipine-olmesartan (AZOR) 10-40 MG tablet   rosuvastatin (CRESTOR) 20 MG tablet    Meds ordered this encounter  Medications   amLODipine-olmesartan (AZOR) 10-40 MG tablet     Sig: Take 1 tablet by mouth daily.    Dispense:  90 tablet    Refill:  3   rosuvastatin (CRESTOR) 20 MG tablet    Sig: Take 1 tablet (20 mg total) by mouth daily.    Dispense:  90 tablet    Refill:  3   metFORMIN (GLUCOPHAGE) 500 MG tablet    Sig: Take 1 tablet (500 mg total) by mouth 2 (two) times daily with a meal.    Dispense:  180 tablet    Refill:  3    Follow-up: Return in about 6 months (around 04/21/2022) for Annual physical.    Lindell Spar, MD

## 2021-10-19 NOTE — Assessment & Plan Note (Signed)
Smokes about 0.5 pack/day  Asked about quitting: confirms that he/she currently smokes cigarettes Advise to quit smoking: Educated about QUITTING to reduce the risk of cancer, cardio and cerebrovascular disease. Assess willingness: Unwilling to quit at this time, but is working on cutting back. Assist with counseling and pharmacotherapy: Counseled for 5 minutes and literature provided. Arrange for follow up: follow up in 3 months and continue to offer help. 

## 2021-10-21 LAB — BASIC METABOLIC PANEL
BUN/Creatinine Ratio: 9 — ABNORMAL LOW (ref 10–24)
BUN: 11 mg/dL (ref 8–27)
CO2: 27 mmol/L (ref 20–29)
Calcium: 9.6 mg/dL (ref 8.6–10.2)
Chloride: 101 mmol/L (ref 96–106)
Creatinine, Ser: 1.23 mg/dL (ref 0.76–1.27)
Glucose: 169 mg/dL — ABNORMAL HIGH (ref 70–99)
Potassium: 4.1 mmol/L (ref 3.5–5.2)
Sodium: 142 mmol/L (ref 134–144)
eGFR: 58 mL/min/{1.73_m2} — ABNORMAL LOW (ref >59)

## 2021-10-21 LAB — MICROALBUMIN / CREATININE URINE RATIO
Creatinine, Urine: 316.1 mg/dL
Microalb/Creat Ratio: 17 mg/g creat (ref 0–29)
Microalbumin, Urine: 53.6 ug/mL

## 2021-10-21 LAB — HEMOGLOBIN A1C
Est. average glucose Bld gHb Est-mCnc: 157 mg/dL
Hgb A1c MFr Bld: 7.1 % — ABNORMAL HIGH (ref 4.8–5.6)

## 2022-01-17 ENCOUNTER — Ambulatory Visit (INDEPENDENT_AMBULATORY_CARE_PROVIDER_SITE_OTHER): Payer: Medicare HMO

## 2022-01-17 DIAGNOSIS — Z2821 Immunization not carried out because of patient refusal: Secondary | ICD-10-CM

## 2022-01-17 DIAGNOSIS — Z0000 Encounter for general adult medical examination without abnormal findings: Secondary | ICD-10-CM

## 2022-01-17 NOTE — Patient Instructions (Signed)
  Mr. Vanvoorhis , Thank you for taking time to come for your Medicare Wellness Visit. I appreciate your ongoing commitment to your health goals. Please review the following plan we discussed and let me know if I can assist you in the future.   These are the goals we discussed:  Goals      Patient Stated     Stay Healthy     Patient Stated     Patient states that his goal is to enjoy life      Quit Smoking     Quit smoking / using tobacco        This is a list of the screening recommended for you and due dates:  Health Maintenance  Topic Date Due   COVID-19 Vaccine (1) Never done   Eye exam for diabetics  Never done   Tetanus Vaccine  Never done   Zoster (Shingles) Vaccine (1 of 2) Never done   Complete foot exam   12/17/2021   Pneumonia Vaccine (1 - PCV) 04/21/2022*   Hemoglobin A1C  04/21/2022   Yearly kidney function blood test for diabetes  10/20/2022   Yearly kidney health urinalysis for diabetes  10/20/2022   Medicare Annual Wellness Visit  01/18/2023   HPV Vaccine  Aged Out   Flu Shot  Discontinued  *Topic was postponed. The date shown is not the original due date.

## 2022-01-17 NOTE — Progress Notes (Signed)
Subjective:   Jerry West is a 87 y.o. male who presents for Medicare Annual/Subsequent preventive examination. I connected with  Luiz Blare on 01/17/22 by a audio enabled telemedicine application and verified that I am speaking with the correct person using two identifiers.  Patient Location: Home  Provider Location: Office/Clinic  I discussed the limitations of evaluation and management by telemedicine. The patient expressed understanding and agreed to proceed.   Review of Systems           Objective:    There were no vitals filed for this visit. There is no height or weight on file to calculate BMI.     01/07/2021    8:29 AM 01/07/2020    9:48 AM 08/22/2019    5:55 PM 07/14/2019    6:38 PM 03/30/2018    4:35 PM 10/05/2016    1:02 PM 09/27/2016    1:13 AM  Advanced Directives  Does Patient Have a Medical Advance Directive? No No No No No No No  Would patient like information on creating a medical advance directive? No - Patient declined No - Patient declined   No - Patient declined      Current Medications (verified) Outpatient Encounter Medications as of 01/17/2022  Medication Sig   acetaminophen (TYLENOL) 500 MG tablet Take 1 tablet (500 mg total) by mouth every 6 (six) hours as needed.   amLODipine-olmesartan (AZOR) 10-40 MG tablet Take 1 tablet by mouth daily.   metFORMIN (GLUCOPHAGE) 500 MG tablet Take 1 tablet (500 mg total) by mouth 2 (two) times daily with a meal.   rosuvastatin (CRESTOR) 20 MG tablet Take 1 tablet (20 mg total) by mouth daily.   sildenafil (VIAGRA) 25 MG tablet Take 1 tablet (25 mg total) by mouth daily as needed for erectile dysfunction.   No facility-administered encounter medications on file as of 01/17/2022.    Allergies (verified) Patient has no known allergies.   History: Past Medical History:  Diagnosis Date   Alcoholism in remission ([place]) 12/19/2016   Cancer ([place])    prostate   Cataract    Diabetes mellitus     Type II, denies being a diabetic at this time   Elevated blood sugar 12/19/2016   History of prostate cancer 12/19/2016   HLD (hyperlipidemia) 01/03/2017   Hypertension    Tobacco abuse 12/19/2016   Past Surgical History:  Procedure Laterality Date   APPENDECTOMY     CHOLECYSTECTOMY     FRACTURE SURGERY     Family History  Problem Relation Age of Onset   Stroke Mother    Alzheimer's disease Father    Alcohol abuse Father    Arthritis Father    Social History   Socioeconomic History   Marital status: Married    Spouse name: Enid Derry   Number of children: 3   Years of education: 6   Highest education level: Not on file  Occupational History   Occupation: retired    Comment: farming, [place]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  Tobacco Use   Smoking status: Every Day    Packs/day: 0.75    Types: Cigarettes    Start date: 03/06/1946   Smokeless tobacco: Never  Vaping Use   Vaping Use: Never used  Substance and Sexual Activity   Alcohol use: No    Comment: prior alcoholic stopped in 9470   Drug use: No   Sexual activity: Not Currently  Other Topics Concern   Not on file  Social History Narrative   Lives with  Enid Derry 2nd wife    6th grade education   Reads "pretty good"   3 children    Close by       Enjoys: outside cutting grass        Diet: all food groups    Caffeine: 2 cups daily, sodas sometimes     Water: 6-8 cups daily       Wears seat belt   Smoke detectors    Does not use phone while driving            Social Determinants of Health   Financial Resource Strain: Low Risk  (01/07/2021)   Overall Financial Resource Strain (CARDIA)    Difficulty of Paying Living Expenses: Not hard at all  Food Insecurity: No Food Insecurity (01/07/2021)   Hunger Vital Sign    Worried About Running Out of Food in the Last Year: Never true    Ran Out of Food in the Last Year: Never true  Transportation Needs: No Transportation Needs (01/07/2021)   PRAPARE - Radiographer, therapeutic (Medical): No    Lack of Transportation (Non-Medical): No  Physical Activity: Sufficiently Active (01/07/2021)   Exercise Vital Sign    Days of Exercise per Week: 3 days    Minutes of Exercise per Session: 60 min  Stress: No Stress Concern Present (01/07/2021)   [place]    Feeling of Stress : Not at all  Social Connections: Moderately Integrated (01/07/2021)   Social Connection and Isolation Panel [NHANES]    Frequency of Communication with Friends and Family: Twice a week    Frequency of Social Gatherings with Friends and Family: Twice a week    Attends Religious Services: 1 to 4 times per year    Active Member of [employer] or Organizations: No    Attends Music therapist: Never    Marital Status: Married    Tobacco Counseling Ready to quit: Not Answered Counseling given: Not Answered   Clinical Intake:                 Diabetic?yes   Nutrition Risk Assessment:  Has the patient had any N/V/D within the last 2 months?  No  Does the patient have any non-healing wounds?  No  Has the patient had any unintentional weight loss or weight gain?  No   Diabetes:  Is the patient diabetic?  Yes  If diabetic, was a CBG obtained today?  No  Did the patient bring in their glucometer from home?  No  How often do you monitor your CBG's? No .   Financial Strains and Diabetes Management:  Are you having any financial strains with the device, your supplies or your medication? No .  Does the patient want to be seen by Chronic Care Management for management of their diabetes?  No  Would the patient like to be referred to a Nutritionist or for Diabetic Management?  No   Diabetic Exams:  Diabetic Eye Exam: Overdue for diabetic eye exam. Pt has been advised about the importance in completing this exam. Patient advised to call and schedule an eye exam. Diabetic Foot Exam: Overdue, Pt has been  advised about the importance in completing this exam. Pt is scheduled for diabetic foot exam on  .       Activities of Daily Living     No data to display          Patient Care  Team: Lindell Spar, MD as PCP - General (Internal Medicine)  Indicate any recent Medical Services you may have received from other than [facility] providers in the past year (date may be approximate).     Assessment:   This is a routine wellness examination for Stelios.  Hearing/Vision screen No results found.  Dietary issues and exercise activities discussed:     Goals Addressed   None    Depression Screen    10/19/2021    8:49 AM 04/21/2021    3:53 PM 01/07/2021    8:29 AM 01/07/2021    8:27 AM 12/17/2020    8:42 AM 08/18/2020    8:11 AM 07/15/2020    8:09 AM  PHQ 2/9 Scores  PHQ - 2 Score 0 0 0 0 0 0 0    Fall Risk    10/19/2021    8:49 AM 04/21/2021    3:53 PM 01/07/2021    8:29 AM 12/17/2020    8:42 AM 08/18/2020    8:11 AM  Fall Risk   Falls in the past year? 0 0 0 0 0  Number falls in past yr: 0 0 0 0 0  Injury with Fall? 0 0 0 0 0  Risk for fall due to : No Fall Risks No Fall Risks No Fall Risks No Fall Risks No Fall Risks  Follow up Falls evaluation completed Falls evaluation completed Falls evaluation completed Falls evaluation completed Falls evaluation completed    FALL RISK PREVENTION PERTAINING TO THE HOME:  Any stairs in or around the home? Yes  If so, are there any without handrails? No  Home free of loose throw rugs in walkways, pet beds, electrical cords, etc? Yes  Adequate lighting in your home to reduce risk of falls? Yes   ASSISTIVE DEVICES UTILIZED TO PREVENT FALLS:  Life alert? Yes  Use of a cane, walker or w/c? No  Grab bars in the bathroom? Yes  Shower chair or bench in shower? No  Elevated toilet seat or a handicapped toilet? Yes   TIMED UP AND GO:  Was the test performed? No .  Length of time to ambulate 10 feet:  sec.     Cognitive  Function:    01/07/2021    8:32 AM  MMSE - Mini Mental State Exam  Not completed: Unable to complete        01/07/2021    8:32 AM 01/07/2020    9:49 AM  6CIT Screen  What Year? 0 points 0 points  What month? 0 points 0 points  What time? 0 points 0 points  Count back from 20 0 points 0 points  Months in reverse 4 points 4 points  Repeat phrase 10 points 0 points  Total Score 14 points 4 points    Immunizations  There is no immunization history on file for this patient.  TDAP status: Due, Education has been provided regarding the importance of this vaccine. Advised may receive this vaccine at local pharmacy or Health Dept. Aware to provide a copy of the vaccination record if obtained from local pharmacy or Health Dept. Verbalized acceptance and understanding.  Flu Vaccine status: Declined, Education has been provided regarding the importance of this vaccine but patient still declined. Advised may receive this vaccine at local pharmacy or Health Dept. Aware to provide a copy of the vaccination record if obtained from local pharmacy or Health Dept. Verbalized acceptance and understanding.  Pneumococcal vaccine status: Declined,  Education has been provided regarding the  importance of this vaccine but patient still declined. Advised may receive this vaccine at local pharmacy or Health Dept. Aware to provide a copy of the vaccination record if obtained from local pharmacy or Health Dept. Verbalized acceptance and understanding.   Covid-19 vaccine status: Information provided on how to obtain vaccines.   Qualifies for Shingles Vaccine? Yes   Zostavax completed No   Shingrix Completed?: No.    Education has been provided regarding the importance of this vaccine. Patient has been advised to call insurance company to determine out of pocket expense if they have not yet received this vaccine. Advised may also receive vaccine at local pharmacy or Health Dept. Verbalized acceptance and  understanding.  Screening Tests Health Maintenance  Topic Date Due   COVID-19 Vaccine (1) Never done   OPHTHALMOLOGY EXAM  Never done   TETANUS/TDAP  Never done   Zoster Vaccines- Shingrix (1 of 2) Never done   INFLUENZA VACCINE  Never done   FOOT EXAM  12/17/2021   Medicare Annual Wellness (AWV)  01/07/2022   Pneumonia Vaccine 39+ Years old (1 - PCV) 04/21/2022 (Originally 11/13/1940)   HEMOGLOBIN A1C  04/21/2022   Diabetic kidney evaluation - GFR measurement  10/20/2022   Diabetic kidney evaluation - Urine ACR  10/20/2022   HPV VACCINES  Aged Out    Health Maintenance  Health Maintenance Due  Topic Date Due   COVID-19 Vaccine (1) Never done   OPHTHALMOLOGY EXAM  Never done   TETANUS/TDAP  Never done   Zoster Vaccines- Shingrix (1 of 2) Never done   INFLUENZA VACCINE  Never done   FOOT EXAM  12/17/2021   Medicare Annual Wellness (AWV)  01/07/2022    Colorectal cancer screening: No longer required.   Lung Cancer Screening: (Low Dose CT Chest recommended if Age 51-80 years, 30 pack-year currently smoking OR have quit w/in 15years.) does qualify.   Lung Cancer Screening Referral:   Additional Screening:  Hepatitis C Screening: does not qualify; Completed   Vision Screening: Recommended annual ophthalmology exams for early detection of glaucoma and other disorders of the eye. Is the patient up to date with their annual eye exam?  No  Who is the provider or what is the name of the office in which the patient attends annual eye exams? My eye doctor If pt is not established with a provider, would they like to be referred to a provider to establish care? No .   Dental Screening: Recommended annual dental exams for proper oral hygiene  Community Resource Referral / Chronic Care Management: CRR required this visit?  No   CCM required this visit?  No      Plan:     I have personally reviewed and noted the following in the patient's chart:   Medical and social  history Use of alcohol, tobacco or illicit drugs  Current medications and supplements including opioid prescriptions. Patient is not currently taking opioid prescriptions. Functional ability and status Nutritional status Physical activity Advanced directives List of other physicians Hospitalizations, surgeries, and ER visits in previous 12 months Vitals Screenings to include cognitive, depression, and falls Referrals and appointments  In addition, I have reviewed and discussed with patient certain preventive protocols, quality metrics, and best practice recommendations. A written personalized care plan for preventive services as well as general preventive health recommendations were provided to patient.     Jill Side, [place]   01/17/2022   Nurse Notes:

## 2022-04-25 ENCOUNTER — Encounter: Payer: Self-pay | Admitting: Internal Medicine

## 2022-04-25 ENCOUNTER — Encounter: Payer: Medicare HMO | Admitting: Internal Medicine

## 2022-05-18 ENCOUNTER — Encounter: Payer: Self-pay | Admitting: Internal Medicine

## 2022-05-18 ENCOUNTER — Ambulatory Visit (INDEPENDENT_AMBULATORY_CARE_PROVIDER_SITE_OTHER): Payer: Medicare HMO | Admitting: Internal Medicine

## 2022-05-18 VITALS — BP 138/84 | HR 97 | Ht 70.0 in | Wt 177.4 lb

## 2022-05-18 DIAGNOSIS — F17219 Nicotine dependence, cigarettes, with unspecified nicotine-induced disorders: Secondary | ICD-10-CM | POA: Diagnosis not present

## 2022-05-18 DIAGNOSIS — E782 Mixed hyperlipidemia: Secondary | ICD-10-CM | POA: Diagnosis not present

## 2022-05-18 DIAGNOSIS — Z0001 Encounter for general adult medical examination with abnormal findings: Secondary | ICD-10-CM | POA: Diagnosis not present

## 2022-05-18 DIAGNOSIS — E559 Vitamin D deficiency, unspecified: Secondary | ICD-10-CM | POA: Diagnosis not present

## 2022-05-18 DIAGNOSIS — E1169 Type 2 diabetes mellitus with other specified complication: Secondary | ICD-10-CM

## 2022-05-18 DIAGNOSIS — I10 Essential (primary) hypertension: Secondary | ICD-10-CM

## 2022-05-18 NOTE — Assessment & Plan Note (Addendum)
BP Readings from Last 1 Encounters:  05/18/22 138/84   Well-controlled with Azor 10-40 mg QD Counseled for compliance with the medications Advised DASH diet and moderate exercise/walking, at least 150 mins/week

## 2022-05-18 NOTE — Assessment & Plan Note (Signed)
Physical exam as documented. Fasting blood tests today. Advised to get Shingrix and Tdap vaccines at local pharmacy. Denies flu and pneumococcal vaccines.

## 2022-05-18 NOTE — Assessment & Plan Note (Signed)
Lab Results  Component Value Date   HGBA1C 7.1 (H) 10/19/2021   Well-controlled On metformin Advised to follow diabetic diet On statin and ARB F/u CMP and lipid panel Diabetic eye exam: Advised to follow up with Ophthalmology for diabetic eye exam

## 2022-05-18 NOTE — Patient Instructions (Addendum)
Please continue to take medications as prescribed.  Please continue to follow low carb diet and perform moderate exercise/walking at least 150 mins/week.  Please get eye evaluation done by local optometrist.

## 2022-05-19 ENCOUNTER — Other Ambulatory Visit: Payer: Self-pay | Admitting: Internal Medicine

## 2022-05-19 DIAGNOSIS — E782 Mixed hyperlipidemia: Secondary | ICD-10-CM

## 2022-05-19 DIAGNOSIS — E1169 Type 2 diabetes mellitus with other specified complication: Secondary | ICD-10-CM

## 2022-05-19 LAB — CBC WITH DIFFERENTIAL/PLATELET
Basophils Absolute: 0.1 10*3/uL (ref 0.0–0.2)
Basos: 2 %
EOS (ABSOLUTE): 0.2 10*3/uL (ref 0.0–0.4)
Eos: 5 %
Hematocrit: 46.6 % (ref 37.5–51.0)
Hemoglobin: 15.7 g/dL (ref 13.0–17.7)
Immature Grans (Abs): 0 10*3/uL (ref 0.0–0.1)
Immature Granulocytes: 0 %
Lymphocytes Absolute: 1.5 10*3/uL (ref 0.7–3.1)
Lymphs: 43 %
MCH: 30.7 pg (ref 26.6–33.0)
MCHC: 33.7 g/dL (ref 31.5–35.7)
MCV: 91 fL (ref 79–97)
Monocytes Absolute: 0.3 10*3/uL (ref 0.1–0.9)
Monocytes: 9 %
Neutrophils Absolute: 1.5 10*3/uL (ref 1.4–7.0)
Neutrophils: 41 %
Platelets: 224 10*3/uL (ref 150–450)
RBC: 5.12 x10E6/uL (ref 4.14–5.80)
RDW: 12.2 % (ref 11.6–15.4)
WBC: 3.6 10*3/uL (ref 3.4–10.8)

## 2022-05-19 LAB — CMP14+EGFR
ALT: 7 IU/L (ref 0–44)
AST: 16 IU/L (ref 0–40)
Albumin/Globulin Ratio: 1.3 (ref 1.2–2.2)
Albumin: 4.1 g/dL (ref 3.7–4.7)
Alkaline Phosphatase: 96 IU/L (ref 44–121)
BUN/Creatinine Ratio: 8 — ABNORMAL LOW (ref 10–24)
BUN: 10 mg/dL (ref 8–27)
Bilirubin Total: 0.5 mg/dL (ref 0.0–1.2)
CO2: 28 mmol/L (ref 20–29)
Calcium: 9.6 mg/dL (ref 8.6–10.2)
Chloride: 99 mmol/L (ref 96–106)
Creatinine, Ser: 1.28 mg/dL — ABNORMAL HIGH (ref 0.76–1.27)
Globulin, Total: 3.1 g/dL (ref 1.5–4.5)
Glucose: 148 mg/dL — ABNORMAL HIGH (ref 70–99)
Potassium: 4.2 mmol/L (ref 3.5–5.2)
Sodium: 141 mmol/L (ref 134–144)
Total Protein: 7.2 g/dL (ref 6.0–8.5)
eGFR: 55 mL/min/{1.73_m2} — ABNORMAL LOW (ref >59)

## 2022-05-19 LAB — HEMOGLOBIN A1C
Est. average glucose Bld gHb Est-mCnc: 206 mg/dL
Hgb A1c MFr Bld: 8.8 % — ABNORMAL HIGH (ref 4.8–5.6)

## 2022-05-19 LAB — LIPID PANEL
Chol/HDL Ratio: 4.9 ratio (ref 0.0–5.0)
Cholesterol, Total: 175 mg/dL (ref 100–199)
HDL: 36 mg/dL — ABNORMAL LOW (ref >39)
LDL Chol Calc (NIH): 111 mg/dL — ABNORMAL HIGH (ref 0–99)
Triglycerides: 157 mg/dL — ABNORMAL HIGH (ref 0–149)
VLDL Cholesterol Cal: 28 mg/dL (ref 5–40)

## 2022-05-19 LAB — VITAMIN D 25 HYDROXY (VIT D DEFICIENCY, FRACTURES): Vit D, 25-Hydroxy: 15.7 ng/mL — ABNORMAL LOW (ref 30.0–100.0)

## 2022-05-19 LAB — TSH: TSH: 1.07 u[IU]/mL (ref 0.450–4.500)

## 2022-05-19 MED ORDER — ROSUVASTATIN CALCIUM 40 MG PO TABS: 40.0000 mg | ORAL_TABLET | Freq: Every day | ORAL | 3 refills | Status: DC

## 2022-05-19 MED ORDER — METFORMIN HCL 1000 MG PO TABS: 1000.0000 mg | ORAL_TABLET | Freq: Two times a day (BID) | ORAL | 1 refills | Status: DC

## 2022-05-19 NOTE — Progress Notes (Signed)
Established Patient Office Visit  Subjective:  Patient ID: Jerry West, male    DOB: May 18, 1934  Age: 88 y.o. MRN: [id number]  CC:  Chief Complaint  Patient presents with   Annual Exam    HPI Jerry West is a 88 y.o. male with past medical history of HTN, type 2 DM and HLD who presents for annual physical.  He has been doing well overall.  BP is well-controlled. Takes medications regularly. Patient denies headache, dizziness, chest pain, dyspnea or palpitations.  He complains of erectile dysfunction.  Denies any dysuria, hematuria, urinary retention or hesitance, urethral discharge or pain.  He denies pneumococcal and Shingrix vaccines in the office today.   Past Medical History:  Diagnosis Date   Alcoholism in remission ([place]) 12/19/2016   Cancer ([place])    prostate   Cataract    Diabetes mellitus    Type II, denies being a diabetic at this time   Elevated blood sugar 12/19/2016   History of prostate cancer 12/19/2016   HLD (hyperlipidemia) 01/03/2017   Hypertension    Tobacco abuse 12/19/2016    Past Surgical History:  Procedure Laterality Date   APPENDECTOMY     CHOLECYSTECTOMY     FRACTURE SURGERY      Family History  Problem Relation Age of Onset   Stroke Mother    Alzheimer's disease Father    Alcohol abuse Father    Arthritis Father     Social History   Socioeconomic History   Marital status: Married    Spouse name: Jerry West   Number of children: 3   Years of education: 6   Highest education level: Not on file  Occupational History   Occupation: retired    Comment: farming, [place]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  Tobacco Use   Smoking status: Every Day    Packs/day: .81    Types: Cigarettes    Start date: 03/06/1946   Smokeless tobacco: Never  Vaping Use   Vaping Use: Never used  Substance and Sexual Activity   Alcohol use: No    Comment: prior alcoholic stopped in 0000000   Drug use: No   Sexual activity: Not Currently  Other Topics Concern   Not  on file  Social History Narrative   Lives with Jerry West 2nd wife    6th grade education   Reads "pretty good"   3 children    Close by       Enjoys: outside cutting grass        Diet: all food groups    Caffeine: 2 cups daily, sodas sometimes     Water: 6-8 cups daily       Wears seat belt   Smoke detectors    Does not use phone while driving            Social Determinants of Health   Financial Resource Strain: Low Risk  (01/17/2022)   Overall Financial Resource Strain (CARDIA)    Difficulty of Paying Living Expenses: Not hard at all  Food Insecurity: No Food Insecurity (01/17/2022)   Hunger Vital Sign    Worried About Running Out of Food in the Last Year: Never true    [place] in the Last Year: Never true  Transportation Needs: No Transportation Needs (01/17/2022)   PRAPARE - Hydrologist (Medical): No    Lack of Transportation (Non-Medical): No  Physical Activity: Sufficiently Active (01/07/2021)   Exercise Vital Sign    Days of Exercise  per Week: 3 days    Minutes of Exercise per Session: 60 min  Stress: No Stress Concern Present (01/17/2022)   [place]    Feeling of Stress : Not at all  Social Connections: Moderately Isolated (01/17/2022)   Social Connection and Isolation Panel [NHANES]    Frequency of Communication with Friends and Family: Twice a week    Frequency of Social Gatherings with Friends and Family: Never    Attends Religious Services: 1 to 4 times per year    Active Member of [employer] or Organizations: No    Attends Archivist Meetings: Never    Marital Status: Married  Human resources officer Violence: Not At Risk (01/07/2021)   Humiliation, Afraid, Rape, and Kick questionnaire    Fear of Current or Ex-Partner: No    Emotionally Abused: No    Physically Abused: No    Sexually Abused: No    Outpatient Medications Prior to Visit  Medication Sig  Dispense Refill   acetaminophen (TYLENOL) 500 MG tablet Take 1 tablet (500 mg total) by mouth every 6 (six) hours as needed. 30 tablet 0   amLODipine-olmesartan (AZOR) 10-40 MG tablet Take 1 tablet by mouth daily. 90 tablet 3   sildenafil (VIAGRA) 25 MG tablet Take 1 tablet (25 mg total) by mouth daily as needed for erectile dysfunction. 10 tablet 2   metFORMIN (GLUCOPHAGE) 500 MG tablet Take 1 tablet (500 mg total) by mouth 2 (two) times daily with a meal. 180 tablet 3   rosuvastatin (CRESTOR) 20 MG tablet Take 1 tablet (20 mg total) by mouth daily. 90 tablet 3   No facility-administered medications prior to visit.    No Known Allergies  ROS Review of Systems  Constitutional:  Negative for chills and fever.  HENT:  Negative for congestion and sore throat.   Eyes:  Negative for pain and discharge.  Respiratory:  Negative for cough and shortness of breath.   Cardiovascular:  Negative for chest pain and palpitations.  Gastrointestinal:  Negative for constipation, diarrhea, nausea and vomiting.  Endocrine: Negative for polydipsia and polyuria.  Genitourinary:  Negative for dysuria and hematuria.  Musculoskeletal:  Negative for neck pain and neck stiffness.  Skin:  Negative for rash.  Neurological:  Negative for dizziness, weakness, numbness and headaches.  Psychiatric/Behavioral:  Negative for agitation and behavioral problems.       Objective:    Physical Exam Vitals reviewed.  Constitutional:      General: He is not in acute distress.    Appearance: He is not diaphoretic.  HENT:     Head: Normocephalic and atraumatic.     Nose: Nose normal.     Mouth/Throat:     Mouth: Mucous membranes are moist.  Eyes:     General: No scleral icterus.    Extraocular Movements: Extraocular movements intact.  Cardiovascular:     Rate and Rhythm: Normal rate and regular rhythm.     Pulses: Normal pulses.     Heart sounds: Normal heart sounds. No murmur heard. Pulmonary:     Breath  sounds: Normal breath sounds. No wheezing or rales.  Abdominal:     Palpations: Abdomen is soft.     Tenderness: There is no abdominal tenderness.  Musculoskeletal:     Cervical back: Neck supple. No tenderness.     Right lower leg: No edema.     Left lower leg: No edema.  Skin:    General: Skin is warm.  Findings: No rash.  Neurological:     General: No focal deficit present.     Mental Status: He is alert and oriented to person, place, and time.     Cranial Nerves: No cranial nerve deficit.     Sensory: No sensory deficit.     Motor: No weakness.  Psychiatric:        Mood and Affect: Mood normal.        Behavior: Behavior normal.     BP 138/84 (BP Location: Right Arm, Patient Position: Sitting, Cuff Size: Large)   Pulse 97   Ht 5\' 10"  (1.778 m)   Wt 177 lb 6.4 oz (80.5 kg)   SpO2 94%   BMI 25.45 kg/m  Wt Readings from Last 3 Encounters:  05/18/22 177 lb 6.4 oz (80.5 kg)  10/19/21 166 lb 9.6 oz (75.6 kg)  04/21/21 171 lb 0.6 oz (77.6 kg)    Lab Results  Component Value Date   TSH 1.070 05/18/2022   Lab Results  Component Value Date   WBC 3.6 05/18/2022   HGB 15.7 05/18/2022   HCT 46.6 05/18/2022   MCV 91 05/18/2022   PLT 224 05/18/2022   Lab Results  Component Value Date   NA 141 05/18/2022   K 4.2 05/18/2022   CO2 28 05/18/2022   GLUCOSE 148 (H) 05/18/2022   BUN 10 05/18/2022   CREATININE 1.28 (H) 05/18/2022   BILITOT 0.5 05/18/2022   ALKPHOS 96 05/18/2022   AST 16 05/18/2022   ALT 7 05/18/2022   PROT 7.2 05/18/2022   ALBUMIN 4.1 05/18/2022   CALCIUM 9.6 05/18/2022   ANIONGAP 9 08/22/2019   EGFR 55 (L) 05/18/2022   Lab Results  Component Value Date   CHOL 175 05/18/2022   Lab Results  Component Value Date   HDL 36 (L) 05/18/2022   Lab Results  Component Value Date   LDLCALC 111 (H) 05/18/2022   Lab Results  Component Value Date   TRIG 157 (H) 05/18/2022   Lab Results  Component Value Date   CHOLHDL 4.9 05/18/2022   Lab Results   Component Value Date   HGBA1C 8.8 (H) 05/18/2022      Assessment & Plan:   Problem List Items Addressed This Visit       Cardiovascular and Mediastinum   Essential hypertension    BP Readings from Last 1 Encounters:  05/18/22 138/84  Well-controlled with Azor 10-40 mg QD Counseled for compliance with the medications Advised DASH diet and moderate exercise/walking, at least 150 mins/week      Relevant Orders   TSH (Completed)   CBC with Differential/Platelet (Completed)     Endocrine   Type 2 diabetes mellitus with other specified complication ([place])    Lab Results  Component Value Date   HGBA1C 7.1 (H) 10/19/2021  Well-controlled On metformin Advised to follow diabetic diet On statin and ARB F/u CMP and lipid panel Diabetic eye exam: Advised to follow up with Ophthalmology for diabetic eye exam      Relevant Orders   Hemoglobin A1c (Completed)   CMP14+EGFR (Completed)     Nervous and Auditory   Cigarette nicotine dependence with nicotine-induced disorder    Smokes about 0.5 pack/day  Asked about quitting: confirms that he/she currently smokes cigarettes Advise to quit smoking: Educated about QUITTING to reduce the risk of cancer, cardio and cerebrovascular disease. Assess willingness: Unwilling to quit at this time, but is working on cutting back. Assist with counseling and pharmacotherapy: Counseled for  5 minutes and literature provided. Arrange for follow up: follow up in 3 months and continue to offer help.        Other   HLD (hyperlipidemia)    On Crestor Check lipid profile      Relevant Orders   Lipid panel (Completed)   Encounter for general adult medical examination with abnormal findings - Primary    Physical exam as documented. Fasting blood tests today. Advised to get Shingrix and Tdap vaccines at local pharmacy. Denies flu and pneumococcal vaccines.      Other Visit Diagnoses     Vitamin D deficiency       Relevant Orders   VITAMIN  D 25 Hydroxy (Vit-D Deficiency, Fractures) (Completed)       No orders of the defined types were placed in this encounter.   Follow-up: Return in about 4 months (around 09/17/2022) for HTN and DM.    Lindell Spar, MD

## 2022-05-19 NOTE — Assessment & Plan Note (Signed)
Smokes about 0.5 pack/day ? ?Asked about quitting: confirms that he/she currently smokes cigarettes ?Advise to quit smoking: Educated about QUITTING to reduce the risk of cancer, cardio and cerebrovascular disease. ?Assess willingness: Unwilling to quit at this time, but is working on cutting back. ?Assist with counseling and pharmacotherapy: Counseled for 5 minutes and literature provided. ?Arrange for follow up: follow up in 3 months and continue to offer help. ?

## 2022-05-19 NOTE — Assessment & Plan Note (Signed)
On Crestor Check lipid profile 

## 2022-06-14 ENCOUNTER — Telehealth: Payer: Self-pay

## 2022-06-14 NOTE — Progress Notes (Signed)
   Care Guide Note  06/14/2022 Name: Jerry West MRN: [id number] DOB: December 16, 1934  Referred by: Anabel Halon, MD Reason for referral : Care Coordination (Outreach per Pharm d to schedule for DM)   Jerry West is a 88 y.o. year old male who is a primary care patient of Anabel Halon, MD. Jerry West was referred to the pharmacist for assistance related to DM.    Successful contact was made with the patient to discuss pharmacy services including being ready for the pharmacist to call at least 5 minutes before the scheduled appointment time, to have medication bottles and any blood sugar or blood pressure readings ready for review. The patient agreed to meet with the pharmacist via with the pharmacist via telephone visit on (date/time).  06/21/2022  Penne Lash, RMA Care Guide [facility]  [place], [place] [address] Direct Dial: [phone] [email or web address]

## 2022-06-21 ENCOUNTER — Other Ambulatory Visit: Payer: Medicare HMO | Admitting: Pharmacist

## 2022-06-21 ENCOUNTER — Encounter: Payer: Self-pay | Admitting: Pharmacist

## 2022-06-21 VITALS — BP 132/78 | HR 65

## 2022-06-21 DIAGNOSIS — E1169 Type 2 diabetes mellitus with other specified complication: Secondary | ICD-10-CM

## 2022-06-21 NOTE — Progress Notes (Signed)
   06/21/2022 Name: Jerry West MRN: [id number] DOB: 1935/02/18  Jerry West is a 88 y.o. year old male who presented for a telephone visit.   They were referred to the pharmacist by their PCP for assistance in managing diabetes and hypertension.   Subjective:  Care Team: Primary Care Provider: Anabel Halon, MD ; Next Scheduled Visit: 08/24/22  Medication Access/Adherence  Current Pharmacy:  [id number] - [place], [place] - [address] AT [facility] [address] [place] [address] Phone: [phone] Fax: [phone]   Patient reports affordability concerns with their medications: No  Patient reports access/transportation concerns to their pharmacy: No  Patient reports adherence concerns with their medications:  No    Diabetes:  Current medications: metformin. GFR 55 Medications tried in the past: n/a  Current glucose readings: n/a Patient is not testing  Patient denies hypoglycemic s/sx including dizziness, shakiness, sweating. Patient denies hyperglycemic symptoms including polyuria, polydipsia, polyphagia, nocturia, neuropathy, blurred vision.  Current physical activity: encouraged as able  Current medication access support: n/a   Objective:  Lab Results  Component Value Date   HGBA1C 8.8 (H) 05/18/2022    Lab Results  Component Value Date   CREATININE 1.28 (H) 05/18/2022   BUN 10 05/18/2022   NA 141 05/18/2022   K 4.2 05/18/2022   CL 99 05/18/2022   CO2 28 05/18/2022    Lab Results  Component Value Date   CHOL 175 05/18/2022   HDL 36 (L) 05/18/2022   LDLCALC 111 (H) 05/18/2022   TRIG 157 (H) 05/18/2022   CHOLHDL 4.9 05/18/2022    Medications Reviewed Today     Reviewed by Danella Maiers, [facility] (Pharmacist) on 06/21/22 at [phone]  Med List Status: <None>   Medication Order Taking? Sig Documenting Provider Last Dose Status Informant  acetaminophen (TYLENOL) 500 MG tablet [id number] No Take 1 tablet (500 mg  total) by mouth every 6 (six) hours as needed. Fayrene Helper, PA-C Taking Active   amLODipine-olmesartan (AZOR) 10-40 MG tablet [id number] No Take 1 tablet by mouth daily. Anabel Halon, MD Taking Active   metFORMIN (GLUCOPHAGE) 1000 MG tablet [id number]  Take 1 tablet (1,000 mg total) by mouth 2 (two) times daily with a meal. Anabel Halon, MD  Active   rosuvastatin (CRESTOR) 40 MG tablet [id number]  Take 1 tablet (40 mg total) by mouth daily. Anabel Halon, MD  Active   sildenafil (VIAGRA) 25 MG tablet [id number] No Take 1 tablet (25 mg total) by mouth daily as needed for erectile dysfunction. Anabel Halon, MD Taking Active   Med List Note Rolene Course 07/24/11 2018): Took medication today             Assessment/Plan:   Diabetes: - Currently uncontrolled; A1c 8.8%, GFR 55 - Reviewed long term cardiovascular and renal outcomes of uncontrolled blood sugar -BP controlled; tries to check it on machines when out & at appts - Reviewed goal A1c, goal fasting, and goal 2 hour post prandial glucose - Reviewed dietary modifications including healthy plate method - Reviewed lifestyle modifications including: walking as able - Recommend to check glucose fasting 3x weekly; [employer] brand relion meter recommended -Patient declined additional therapy at this time; work on dietary and lifestyle modifications   Follow Up Plan: 2 months   Kieth Brightly, PharmD, BCACP Clinical Pharmacist, [facility]

## 2022-08-22 ENCOUNTER — Encounter: Payer: Self-pay | Admitting: Internal Medicine

## 2022-08-22 ENCOUNTER — Telehealth: Payer: Self-pay | Admitting: Internal Medicine

## 2022-08-22 NOTE — Telephone Encounter (Signed)
Patient daughter Lucille Passy called asked for nurse to return her call back at [phone] for personal information to address on his appointment for Thursday, 06.20.2024-needs full evaluation . Patient is currently in jail not able to eat and lost a lot of weight, not able to keep food down.  Request for provider write  a note due to age and health condition that his schedule be changed. Request insure due to not able to hold food down, not sure if taking medication correctly.  Ask for nurse to call her back ASAP if the note can be done.

## 2022-08-22 NOTE — Telephone Encounter (Signed)
Letter printed and given to daughter

## 2022-08-24 ENCOUNTER — Ambulatory Visit (INDEPENDENT_AMBULATORY_CARE_PROVIDER_SITE_OTHER): Payer: Medicare HMO | Admitting: Internal Medicine

## 2022-08-24 ENCOUNTER — Encounter: Payer: Self-pay | Admitting: Internal Medicine

## 2022-08-24 VITALS — BP 124/71 | HR 101 | Ht 70.0 in | Wt 151.0 lb

## 2022-08-24 DIAGNOSIS — F02B3 Dementia in other diseases classified elsewhere, moderate, with mood disturbance: Secondary | ICD-10-CM | POA: Diagnosis not present

## 2022-08-24 DIAGNOSIS — K219 Gastro-esophageal reflux disease without esophagitis: Secondary | ICD-10-CM | POA: Diagnosis not present

## 2022-08-24 DIAGNOSIS — E440 Moderate protein-calorie malnutrition: Secondary | ICD-10-CM

## 2022-08-24 DIAGNOSIS — E1169 Type 2 diabetes mellitus with other specified complication: Secondary | ICD-10-CM

## 2022-08-24 DIAGNOSIS — G309 Alzheimer's disease, unspecified: Secondary | ICD-10-CM

## 2022-08-24 DIAGNOSIS — I10 Essential (primary) hypertension: Secondary | ICD-10-CM | POA: Diagnosis not present

## 2022-08-24 MED ORDER — OMEPRAZOLE 40 MG PO CPDR: 40.0000 mg | DELAYED_RELEASE_CAPSULE | Freq: Every day | ORAL | 3 refills | Status: DC

## 2022-08-24 NOTE — Patient Instructions (Signed)
Please start taking Omeprazole as prescribed.  Please maintain at least 64 ounces of fluid in a day and eat at regular intervals.  Please continue to take other medications as prescribed.  Please continue to follow low carb diet and ambulate as tolerated.

## 2022-08-24 NOTE — Assessment & Plan Note (Addendum)
Lab Results  Component Value Date   HGBA1C 8.8 (H) 05/18/2022   Was uncontrolled due to diet noncompliance, but has not been eating lately - would avoid changing his regimen for now On metformin Advised to follow diabetic diet On statin and ARB F/u CMP and HbA1c Diabetic eye exam: Advised to follow up with Ophthalmology for diabetic eye exam

## 2022-08-24 NOTE — Assessment & Plan Note (Addendum)
MoCA: 18/30 Although current evaluation is likely worse due to his recent malnutrition and acute stressors, he likely has underlying cognitive decline Can consider donepezil after reevaluation Has anhedonia, lack of appetite and insomnia likely due to acute adjustment - can consider Remeron later

## 2022-08-24 NOTE — Assessment & Plan Note (Signed)
BMI Readings from Last 3 Encounters:  08/24/22 21.67 kg/m  05/18/22 25.45 kg/m  10/19/21 23.90 kg/m   About 27 lbs weight loss in 3 months Likely due to poor p.o. intake in the low enforcement facility Needs to take protein supplements and eat at regular intervals -provided letter to facilitate food according to his medical conditions

## 2022-08-24 NOTE — Assessment & Plan Note (Signed)
His epigastric pain could be due to gastritis Started omeprazole Provided letter to facilitate food that is not too hot, spicy or fried

## 2022-08-24 NOTE — Progress Notes (Signed)
Established Patient Office Visit  Subjective:  Patient ID: Jerry West, male    DOB: [birth year]  Age: 88 y.o. MRN: [id number]  CC:  Chief Complaint  Patient presents with   Hypertension   Diabetes   no appetite    Patient has went 18 days without eating anything and has been hoarse   Memory Loss    Daughter would like a memory test done     HPI Jerry West is a 88 y.o. male with past medical history of HTN, type 2 DM and HLD who presents for f/u of his chronic medical conditions.  His daughter is present during the visit today.  He came out from prison/low enforcement facility yesterday for medical visit.  He is not tolerating the food inside the facility and has not been for the last 18 days.  He has lost about 27 lbs since the last visit.  He denies loss of appetite, but reports that he has epigastric burning after eating in the facility.  He has been able to tolerate Ensure supplement since getting back to home yesterday.  He still has to be 48 more days in the facility.  BP is well-controlled. Takes medications regularly. Patient denies headache, dizziness, chest pain, dyspnea or palpitations.  Type II DM: He takes metformin for it.  Denies any fatigue, polyuria or polydipsia.  His HbA1c was 8.8 in 03/24.   HLD: Takes Crestor.  His daughter is concerned about his memory.  His MoCA was 18/30.  Since he is experiencing acute weight loss due to poor p.o. intake, he needs repeat evaluation in a better state of health.  Denies any episode of agitation, violent behavior, SI or HI currently.   He denies taking any vaccine.  Past Medical History:  Diagnosis Date   Alcoholism in remission (HCC) 12/19/2016   Cancer (HCC)    prostate   Cataract    Diabetes mellitus    Type II, denies being a diabetic at this time   Elevated blood sugar 12/19/2016   History of prostate cancer 12/19/2016   HLD (hyperlipidemia) 01/03/2017   Hypertension    Tobacco abuse  12/19/2016    Past Surgical History:  Procedure Laterality Date   APPENDECTOMY     CHOLECYSTECTOMY     FRACTURE SURGERY      Family History  Problem Relation Age of Onset   Stroke Mother    Alzheimer's disease Father    Alcohol abuse Father    Arthritis Father     Social History   Socioeconomic History   Marital status: Married    Spouse name: Jerry West   Number of children: 3   Years of education: 6   Highest education level: Not on file  Occupational History   Occupation: retired    Comment: farming, mill work  Tobacco Use   Smoking status: Every Day    Packs/day: .75    Types: Cigarettes    Start date: 03/06/1946   Smokeless tobacco: Never  Vaping Use   Vaping Use: Never used  Substance and Sexual Activity   Alcohol use: No    Comment: prior alcoholic stopped in 1997   Drug use: No   Sexual activity: Not Currently  Other Topics Concern   Not on file  Social History Narrative   Lives with Jerry West 2nd wife    6th grade education   Reads "pretty good"   3 children    Close by       Enjoys:  outside cutting grass        Diet: all food groups    Caffeine: 2 cups daily, sodas sometimes     Water: 6-8 cups daily       Wears seat belt   Smoke detectors    Does not use phone while driving            Social Determinants of Health   Financial Resource Strain: Low Risk  (01/17/2022)   Overall Financial Resource Strain (CARDIA)    Difficulty of Paying Living Expenses: Not hard at all  Food Insecurity: No Food Insecurity (01/17/2022)   Hunger Vital Sign    Worried About Running Out of Food in the Last Year: Never true    Ran Out of Food in the Last Year: Never true  Transportation Needs: No Transportation Needs (01/17/2022)   PRAPARE - Administrator, Civil Service (Medical): No    Lack of Transportation (Non-Medical): No  Physical Activity: Sufficiently Active (01/07/2021)   Exercise Vital Sign    Days of Exercise per Week: 3 days    Minutes  of Exercise per Session: 60 min  Stress: No Stress Concern Present (01/17/2022)   [employer] of Occupational Health - Occupational Stress Questionnaire    Feeling of Stress : Not at all  Social Connections: Moderately Isolated (01/17/2022)   Social Connection and Isolation Panel [NHANES]    Frequency of Communication with Friends and Family: Twice a week    Frequency of Social Gatherings with Friends and Family: Never    Attends Religious Services: 1 to 4 times per year    Active Member of [employer] or Organizations: No    Attends Banker Meetings: Never    Marital Status: Married  Catering manager Violence: Not At Risk (01/07/2021)   Humiliation, Afraid, Rape, and Kick questionnaire    Fear of Current or Ex-Partner: No    Emotionally Abused: No    Physically Abused: No    Sexually Abused: No    Outpatient Medications Prior to Visit  Medication Sig Dispense Refill   acetaminophen (TYLENOL) 500 MG tablet Take 1 tablet (500 mg total) by mouth every 6 (six) hours as needed. 30 tablet 0   amLODipine-olmesartan (AZOR) 10-40 MG tablet Take 1 tablet by mouth daily. 90 tablet 3   metFORMIN (GLUCOPHAGE) 1000 MG tablet Take 1 tablet (1,000 mg total) by mouth 2 (two) times daily with a meal. 180 tablet 1   rosuvastatin (CRESTOR) 40 MG tablet Take 1 tablet (40 mg total) by mouth daily. 90 tablet 3   sildenafil (VIAGRA) 25 MG tablet Take 1 tablet (25 mg total) by mouth daily as needed for erectile dysfunction. 10 tablet 2   No facility-administered medications prior to visit.    No Known Allergies  ROS Review of Systems  Constitutional:  Positive for fatigue and unexpected weight change. Negative for chills and fever.  HENT:  Negative for congestion and sore throat.   Eyes:  Negative for pain and discharge.  Respiratory:  Negative for cough and shortness of breath.   Cardiovascular:  Negative for chest pain and palpitations.  Gastrointestinal:  Positive for abdominal pain.  Negative for constipation, diarrhea, nausea and vomiting.  Endocrine: Negative for polydipsia and polyuria.  Genitourinary:  Negative for dysuria and hematuria.  Musculoskeletal:  Negative for neck pain and neck stiffness.  Skin:  Negative for rash.  Neurological:  Negative for dizziness, weakness, numbness and headaches.  Psychiatric/Behavioral:  Positive for decreased concentration and  sleep disturbance. Negative for agitation and behavioral problems.       Objective:    Physical Exam Vitals reviewed.  Constitutional:      General: He is not in acute distress.    Appearance: He is not diaphoretic.  HENT:     Head: Normocephalic and atraumatic.     Nose: Nose normal.     Mouth/Throat:     Mouth: Mucous membranes are moist.  Eyes:     General: No scleral icterus.    Extraocular Movements: Extraocular movements intact.  Cardiovascular:     Rate and Rhythm: Normal rate and regular rhythm.     Pulses: Normal pulses.     Heart sounds: Normal heart sounds. No murmur heard. Pulmonary:     Breath sounds: Normal breath sounds. No wheezing or rales.  Musculoskeletal:     Cervical back: Neck supple. No tenderness.     Right lower leg: No edema.     Left lower leg: No edema.  Skin:    General: Skin is warm.     Findings: No rash.  Neurological:     General: No focal deficit present.     Mental Status: He is alert and oriented to person, place, and time.     Sensory: No sensory deficit.     Motor: No weakness.  Psychiatric:        Mood and Affect: Mood normal.        Behavior: Behavior normal.     BP 124/71 (BP Location: Left Arm, Patient Position: Sitting, Cuff Size: Normal)   Pulse (!) 101   Ht 5\' 10"  (1.778 m)   Wt 151 lb (68.5 kg)   SpO2 97%   BMI 21.67 kg/m  Wt Readings from Last 3 Encounters:  08/24/22 151 lb (68.5 kg)  05/18/22 177 lb 6.4 oz (80.5 kg)  10/19/21 166 lb 9.6 oz (75.6 kg)    Lab Results  Component Value Date   TSH 1.070 05/18/2022   Lab  Results  Component Value Date   WBC 3.6 05/18/2022   HGB 15.7 05/18/2022   HCT 46.6 05/18/2022   MCV 91 05/18/2022   PLT 224 05/18/2022   Lab Results  Component Value Date   NA 141 05/18/2022   K 4.2 05/18/2022   CO2 28 05/18/2022   GLUCOSE 148 (H) 05/18/2022   BUN 10 05/18/2022   CREATININE 1.28 (H) 05/18/2022   BILITOT 0.5 05/18/2022   ALKPHOS 96 05/18/2022   AST 16 05/18/2022   ALT 7 05/18/2022   PROT 7.2 05/18/2022   ALBUMIN 4.1 05/18/2022   CALCIUM 9.6 05/18/2022   ANIONGAP 9 08/22/2019   EGFR 55 (L) 05/18/2022   Lab Results  Component Value Date   CHOL 175 05/18/2022   Lab Results  Component Value Date   HDL 36 (L) 05/18/2022   Lab Results  Component Value Date   LDLCALC 111 (H) 05/18/2022   Lab Results  Component Value Date   TRIG 157 (H) 05/18/2022   Lab Results  Component Value Date   CHOLHDL 4.9 05/18/2022   Lab Results  Component Value Date   HGBA1C 8.8 (H) 05/18/2022      Assessment & Plan:   Problem List Items Addressed This Visit       Cardiovascular and Mediastinum   Essential hypertension - Primary    BP Readings from Last 1 Encounters:  08/24/22 124/71  Well-controlled with Azor 10-40 mg QD Counseled for compliance with the medications Advised DASH diet  and moderate exercise/walking, at least 150 mins/week        Digestive   Gastroesophageal reflux disease    His epigastric pain could be due to gastritis Started omeprazole Provided letter to facilitate food that is not too hot, spicy or fried      Relevant Medications   omeprazole (PRILOSEC) 40 MG capsule     Endocrine   Type 2 diabetes mellitus with other specified complication (HCC)    Lab Results  Component Value Date   HGBA1C 8.8 (H) 05/18/2022  Was uncontrolled due to diet noncompliance, but has not been eating lately - would avoid changing his regimen for now On metformin Advised to follow diabetic diet On statin and ARB F/u CMP and HbA1c Diabetic eye exam:  Advised to follow up with Ophthalmology for diabetic eye exam      Relevant Orders   CMP14+EGFR   Hemoglobin A1c     Nervous and Auditory   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 intake in the low enforcement facility Needs to take protein supplements and eat at regular intervals -provided letter to facilitate food according to his medical conditions      Meds ordered this encounter  Medications   omeprazole (PRILOSEC) 40 MG capsule    Sig: Take 1 capsule (40 mg total) by mouth daily.    Dispense:  30 capsule    Refill:  3    Follow-up: Return in about 3 months (around 11/24/2022), or if symptoms worsen or fail to improve.    Anabel Halon, MD

## 2022-08-24 NOTE — Assessment & Plan Note (Signed)
BP Readings from Last 1 Encounters:  08/24/22 124/71   Well-controlled with Azor 10-40 mg QD Counseled for compliance with the medications Advised DASH diet and moderate exercise/walking, at least 150 mins/week

## 2022-08-25 LAB — CMP14+EGFR
ALT: 14 IU/L (ref 0–44)
AST: 20 IU/L (ref 0–40)
Albumin: 4.2 g/dL (ref 3.7–4.7)
Alkaline Phosphatase: 105 IU/L (ref 44–121)
BUN/Creatinine Ratio: 13 (ref 10–24)
BUN: 35 mg/dL — ABNORMAL HIGH (ref 8–27)
Bilirubin Total: 0.6 mg/dL (ref 0.0–1.2)
CO2: 27 mmol/L (ref 20–29)
Calcium: 10 mg/dL (ref 8.6–10.2)
Chloride: 92 mmol/L — ABNORMAL LOW (ref 96–106)
Creatinine, Ser: 2.77 mg/dL — ABNORMAL HIGH (ref 0.76–1.27)
Globulin, Total: 3.3 g/dL (ref 1.5–4.5)
Glucose: 200 mg/dL — ABNORMAL HIGH (ref 70–99)
Potassium: 3.2 mmol/L — ABNORMAL LOW (ref 3.5–5.2)
Sodium: 137 mmol/L (ref 134–144)
Total Protein: 7.5 g/dL (ref 6.0–8.5)
eGFR: 22 mL/min/{1.73_m2} — ABNORMAL LOW (ref >59)

## 2022-08-25 LAB — HEMOGLOBIN A1C
Est. average glucose Bld gHb Est-mCnc: 154 mg/dL
Hgb A1c MFr Bld: 7 % — ABNORMAL HIGH (ref 4.8–5.6)

## 2022-09-17 ENCOUNTER — Other Ambulatory Visit: Payer: Self-pay

## 2022-09-17 ENCOUNTER — Emergency Department (HOSPITAL_COMMUNITY)
Admission: EM | Admit: 2022-09-17 | Discharge: 2022-09-17 | Disposition: A | Discharge status: Home / Self Care | Payer: Medicare HMO | Attending: Emergency Medicine | Admitting: Emergency Medicine

## 2022-09-17 DIAGNOSIS — R42 Dizziness and giddiness: Secondary | ICD-10-CM | POA: Diagnosis not present

## 2022-09-17 DIAGNOSIS — E860 Dehydration: Secondary | ICD-10-CM | POA: Diagnosis not present

## 2022-09-17 DIAGNOSIS — N179 Acute kidney failure, unspecified: Secondary | ICD-10-CM | POA: Diagnosis not present

## 2022-09-17 DIAGNOSIS — T675XXA Heat exhaustion, unspecified, initial encounter: Secondary | ICD-10-CM | POA: Diagnosis not present

## 2022-09-17 DIAGNOSIS — R55 Syncope and collapse: Secondary | ICD-10-CM | POA: Diagnosis not present

## 2022-09-17 DIAGNOSIS — R61 Generalized hyperhidrosis: Secondary | ICD-10-CM | POA: Diagnosis not present

## 2022-09-17 LAB — CBC WITH DIFFERENTIAL/PLATELET
Abs Immature Granulocytes: 0.05 10*3/uL (ref 0.00–0.07)
Basophils Absolute: 0 10*3/uL (ref 0.0–0.1)
Basophils Relative: 1 %
Eosinophils Absolute: 0.1 10*3/uL (ref 0.0–0.5)
Eosinophils Relative: 2 %
HCT: 45.5 % (ref 39.0–52.0)
Hemoglobin: 15.1 g/dL (ref 13.0–17.0)
Immature Granulocytes: 1 %
Lymphocytes Relative: 24 %
Lymphs Abs: 1.2 10*3/uL (ref 0.7–4.0)
MCH: 30.4 pg (ref 26.0–34.0)
MCHC: 33.2 g/dL (ref 30.0–36.0)
MCV: 91.7 fL (ref 80.0–100.0)
Monocytes Absolute: 0.6 10*3/uL (ref 0.1–1.0)
Monocytes Relative: 12 %
Neutro Abs: 2.9 10*3/uL (ref 1.7–7.7)
Neutrophils Relative %: 60 %
Platelets: 223 10*3/uL (ref 150–400)
RBC: 4.96 MIL/uL (ref 4.22–5.81)
RDW: 13 % (ref 11.5–15.5)
WBC: 4.8 10*3/uL (ref 4.0–10.5)
nRBC: 0 % (ref 0.0–0.2)

## 2022-09-17 LAB — URINALYSIS, ROUTINE W REFLEX MICROSCOPIC
Bilirubin Urine: NEGATIVE
Glucose, UA: NEGATIVE mg/dL
Ketones, ur: 5 mg/dL — AB
Nitrite: NEGATIVE
Protein, ur: 100 mg/dL — AB
Specific Gravity, Urine: 1.019 (ref 1.005–1.030)
pH: 5 (ref 5.0–8.0)

## 2022-09-17 LAB — BASIC METABOLIC PANEL
Anion gap: 14 (ref 5–15)
BUN: 30 mg/dL — ABNORMAL HIGH (ref 8–23)
CO2: 24 mmol/L (ref 22–32)
Calcium: 9 mg/dL (ref 8.9–10.3)
Chloride: 96 mmol/L — ABNORMAL LOW (ref 98–111)
Creatinine, Ser: 2.61 mg/dL — ABNORMAL HIGH (ref 0.61–1.24)
GFR, Estimated: 23 mL/min — ABNORMAL LOW (ref >60)
Glucose, Bld: 241 mg/dL — ABNORMAL HIGH (ref 70–99)
Potassium: 3.3 mmol/L — ABNORMAL LOW (ref 3.5–5.1)
Sodium: 134 mmol/L — ABNORMAL LOW (ref 135–145)

## 2022-09-17 LAB — TROPONIN I (HIGH SENSITIVITY)
Troponin I (High Sensitivity): 50 ng/L — ABNORMAL HIGH (ref <18)
Troponin I (High Sensitivity): 50 ng/L — ABNORMAL HIGH (ref <18)

## 2022-09-17 MED ORDER — SODIUM CHLORIDE 0.9 % IV BOLUS
1000.0000 mL | Freq: Once | INTRAVENOUS | Status: AC
  Administered 2022-09-17: 1000 mL via INTRAVENOUS

## 2022-09-17 NOTE — ED Triage Notes (Signed)
Pt arrived [facility] from home.Pt was mowing his yard and began feeling dizzy and sat down on a chair. The next thing pt remembers is firefighters around. Family states they saw pt  have a syncope episode. [facility] gave 500 bolus NS in 20 left AC.

## 2022-09-17 NOTE — ED Provider Notes (Signed)
EMERGENCY DEPARTMENT AT [facility] Provider Note   CSN: [id number] Arrival date & time: 09/17/22  1753     History  Chief Complaint  Patient presents with   Loss of Consciousness    Jerry West is a 88 y.o. male.  He was brought in by EMS after a syncopal event at home.  He was out mowing the lawn, it is about 98 degrees outside.  He does not recall being lightheaded but he thinks he sat down and then EMS was there.  He was initially diaphoretic and they gave him 500 cc of fluid for low blood pressure.  He said he feels great now and has no complaints.  He has not been eating well.  The history is provided by the patient and the EMS personnel.  Loss of Consciousness Episode history:  Single Most recent episode:  Today Chronicity:  New Context: normal activity   Relieved by:  Lying down Worsened by:  Nothing Ineffective treatments:  None tried Associated symptoms: diaphoresis   Associated symptoms: no chest pain, no headaches, no nausea, no shortness of breath and no vomiting        Home Medications Prior to Admission medications   Medication Sig Start Date End Date Taking? Authorizing Provider  acetaminophen (TYLENOL) 500 MG tablet Take 1 tablet (500 mg total) by mouth every 6 (six) hours as needed. 08/22/19   Fayrene Helper, PA-C  amLODipine-olmesartan (AZOR) 10-40 MG tablet Take 1 tablet by mouth daily. 10/19/21   Anabel Halon, MD  metFORMIN (GLUCOPHAGE) 1000 MG tablet Take 1 tablet (1,000 mg total) by mouth 2 (two) times daily with a meal. 05/19/22   Anabel Halon, MD  omeprazole (PRILOSEC) 40 MG capsule Take 1 capsule (40 mg total) by mouth daily. 08/24/22   Anabel Halon, MD  rosuvastatin (CRESTOR) 40 MG tablet Take 1 tablet (40 mg total) by mouth daily. 05/19/22   Anabel Halon, MD  sildenafil (VIAGRA) 25 MG tablet Take 1 tablet (25 mg total) by mouth daily as needed for erectile dysfunction. 04/21/21   Anabel Halon, MD       Allergies    Patient has no known allergies.    Review of Systems   Review of Systems  Constitutional:  Positive for diaphoresis.  Eyes:  Negative for visual disturbance.  Respiratory:  Negative for shortness of breath.   Cardiovascular:  Positive for syncope. Negative for chest pain.  Gastrointestinal:  Negative for nausea and vomiting.  Neurological:  Negative for headaches.    Physical Exam Updated Vital Signs BP 124/77 (BP Location: Right Arm)   Pulse 77   Temp 98.2 F (36.8 C) (Oral)   Resp 18   Ht 5\' 10"  (1.778 m)   Wt 77.1 kg   SpO2 94%   BMI 24.39 kg/m  Physical Exam Vitals and nursing note reviewed.  Constitutional:      General: He is not in acute distress.    Appearance: Normal appearance. He is well-developed.  HENT:     Head: Normocephalic and atraumatic.  Eyes:     Conjunctiva/sclera: Conjunctivae normal.  Cardiovascular:     Rate and Rhythm: Normal rate and regular rhythm.     Heart sounds: No murmur heard. Pulmonary:     Effort: Pulmonary effort is normal. No respiratory distress.     Breath sounds: Normal breath sounds.  Abdominal:     Palpations: Abdomen is soft.     Tenderness: There is no  abdominal tenderness.  Musculoskeletal:        General: No deformity. Normal range of motion.     Cervical back: Neck supple.  Skin:    General: Skin is warm and dry.     Capillary Refill: Capillary refill takes less than 2 seconds.  Neurological:     General: No focal deficit present.     Mental Status: He is alert.     Sensory: No sensory deficit.     Motor: No weakness.     ED Results / Procedures / Treatments   Labs (all labs ordered are listed, but only abnormal results are displayed) Labs Reviewed  BASIC METABOLIC PANEL - Abnormal; Notable for the following components:      Result Value   Sodium 134 (*)    Potassium 3.3 (*)    Chloride 96 (*)    Glucose, Bld 241 (*)    BUN 30 (*)    Creatinine, Ser 2.61 (*)    GFR, Estimated 23 (*)     All other components within normal limits  URINALYSIS, ROUTINE W REFLEX MICROSCOPIC - Abnormal; Notable for the following components:   Color, Urine AMBER (*)    APPearance CLOUDY (*)    Hgb urine dipstick MODERATE (*)    Ketones, ur 5 (*)    Protein, ur 100 (*)    Leukocytes,Ua TRACE (*)    Bacteria, UA RARE (*)    All other components within normal limits  TROPONIN I (HIGH SENSITIVITY) - Abnormal; Notable for the following components:   Troponin I (High Sensitivity) 50 (*)    All other components within normal limits  TROPONIN I (HIGH SENSITIVITY) - Abnormal; Notable for the following components:   Troponin I (High Sensitivity) 50 (*)    All other components within normal limits  CBC WITH DIFFERENTIAL/PLATELET  CBG MONITORING, ED    EKG EKG Interpretation Date/Time:  Sunday September 17 2022 18:46:26 EDT Ventricular Rate:  71 PR Interval:  202 QRS Duration:  76 QT Interval:  444 QTC Calculation: 483 R Axis:   41  Text Interpretation: Sinus rhythm Nonspecific T abnormalities, lateral leads Borderline prolonged QT interval new anteriorST/tchanges Confirmed by Meridee Score [phone]) on 09/17/2022 6:49:08 PM  Radiology No results found.  Procedures Procedures    Medications Ordered in ED Medications  sodium chloride 0.9 % bolus 1,000 mL (0 mLs Intravenous Stopped 09/17/22 1931)    ED Course/ Medical Decision Making/ A&P Clinical Course as of 09/18/22 4132  Wynelle Link Sep 17, 2022  2155 Patient states he has not been eating very well.  Creatinine elevated from 4 months ago although similar to 3 weeks ago.  Troponins elevated but flat.  He has ambulated in the department here no further dizziness and tolerating p.o.  Looks very well.  Urinalysis had some white cells but not having any urinary symptoms.  Will need close follow-up with PCP.  Will have him hold his blood pressure medicine for now. [MB]    Clinical Course User Index [MB] Terrilee Files, MD                              Medical Decision Making Amount and/or Complexity of Data Reviewed Labs: ordered.   This patient complains of syncope poor p.o. intake; this involves an extensive number of treatment Options and is a complaint that carries with it a high risk of complications and morbidity. The differential includes dehydration, heat syncope,  arrhythmia, metabolic derangement, renal failure  I ordered, reviewed and interpreted labs, which included CBC normal, chemistries with mildly low potassium elevated glucose, creatinine elevated, urinalysis equivocal for infection, troponins elevated but flat I ordered medication IV fluids and reviewed PMP when indicated. Additional history obtained from patient's family members Previous records obtained and reviewed in epic, patient saw his family medical doctor last month and had elevated creatinine from baseline Cardiac monitoring reviewed, normal sinus rhythm Social determinants considered, ongoing tobacco use depression Critical Interventions: None  After the interventions stated above, I reevaluated the patient and found patient to be awake alert.  Blood pressures trended up.  Orthostatics negative.  Tolerating p.o. here. Admission and further testing considered, no indications for admission but will need close PCP follow-up.  Will hold patient's blood pressure medicine for now.  He understands he will need repeat blood work and PCP evaluation.  Will need to make sure that the creatinine elevation trend does not continue.  Counseled patient to avoid prolonged heat exposure and to try to stay well-hydrated.  Return instructions discussed.         Final Clinical Impression(s) / ED Diagnoses Final diagnoses:  Syncope and collapse  Dehydration  AKI (acute kidney injury) [facility])    Rx / DC Orders ED Discharge Orders     None         Terrilee Files, MD 09/18/22 [phone]

## 2022-09-17 NOTE — ED Notes (Signed)
Pt ambulated around nurses station O2 93-94% HR 80's Pt states felt good walking

## 2022-09-17 NOTE — Discharge Instructions (Signed)
You were seen in the emergency department after a fainting spell after being outside in the sun.  Your blood pressure was low although it did improve with some fluids.  Your kidney number was elevated and this may be a factor of some dehydration also.  Please hold your amlodipine olmesartan.  Keep well-hydrated.  Follow-up with your primary care doctor this week for repeat blood pressure check and repeat lab work.  Return to the emergency department if any worsening or concerning symptoms.

## 2022-09-18 ENCOUNTER — Ambulatory Visit (INDEPENDENT_AMBULATORY_CARE_PROVIDER_SITE_OTHER): Payer: Medicare HMO | Admitting: Family Medicine

## 2022-09-18 ENCOUNTER — Encounter: Payer: Self-pay | Admitting: Family Medicine

## 2022-09-18 ENCOUNTER — Telehealth: Payer: Self-pay | Admitting: Internal Medicine

## 2022-09-18 VITALS — BP 138/73 | HR 97 | Ht 70.0 in | Wt 159.1 lb

## 2022-09-18 DIAGNOSIS — R55 Syncope and collapse: Secondary | ICD-10-CM | POA: Diagnosis not present

## 2022-09-18 NOTE — Assessment & Plan Note (Addendum)
In 1997, the pt was incarcerated for 11 years due to habitual felony offenses. 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 Because of his criminal hx and the firearm possession, he was sentenced to 60 days in prison. After serving 18 days, he appealed to serve the remaining time on weekends due to experiencing illness, poor nutritional intake, and fatigue while incarcerated. He has requested a doctor's note to support his appeal for house arrest to better manage his nutritional needs. Noted provided Encouraged increasing his fluid intake, at least 64 ounces daily Recommended high protein and calorie diet Encouraged to f/u with pcp in 2 weeks

## 2022-09-18 NOTE — Progress Notes (Signed)
Established Patient Office Visit  Subjective:  Patient ID: Jerry West, male    DOB: Nov 22, 1934  Age: 88 y.o. MRN: [id number]  CC:  Chief Complaint  Patient presents with   Follow-up    ER f/u from 09/17/22 feeling weak today. Needs a note stating pt is unable to keep anything down, he needs to be at home to keep the nutrition he needs.     HPI Jerry West is a 88 y.o. male with past medical history of T2DM, GERD, HTN and HLP presents for ED follow up.  Syncope: The patient was seen in the ED on 09/17/2022 after a syncope event at home.  He presents to the ED diaphoretic and was given 500 cc of fluids for low BP.  Physical examination in the ED was unremarkable. UA showed a trace of WBC but no symptoms of UTI. The patient was discharged and was advised to hold his BP medications. C/o fatigue with no dizziness or lightheadedness.     Past Medical History:  Diagnosis Date   Alcoholism in remission (HCC) 12/19/2016   Cancer (HCC)    prostate   Cataract    Diabetes mellitus    Type II, denies being a diabetic at this time   Elevated blood sugar 12/19/2016   History of prostate cancer 12/19/2016   HLD (hyperlipidemia) 01/03/2017   Hypertension    Tobacco abuse 12/19/2016    Past Surgical History:  Procedure Laterality Date   APPENDECTOMY     CHOLECYSTECTOMY     FRACTURE SURGERY      Family History  Problem Relation Age of Onset   Stroke Mother    Alzheimer's disease Father    Alcohol abuse Father    Arthritis Father     Social History   Socioeconomic History   Marital status: Married    Spouse name: Talbert Forest   Number of children: 3   Years of education: 6   Highest education level: Not on file  Occupational History   Occupation: retired    Comment: farming, mill work  Tobacco Use   Smoking status: Every Day    Current packs/day: 0.75    Average packs/day: 0.8 packs/day for 76.5 years (57.4 ttl pk-yrs)    Types: Cigarettes    Start date:  03/06/1946   Smokeless tobacco: Never  Vaping Use   Vaping status: Never Used  Substance and Sexual Activity   Alcohol use: No    Comment: prior alcoholic stopped in 1997   Drug use: No   Sexual activity: Not Currently  Other Topics Concern   Not on file  Social History Narrative   Lives with Talbert Forest 2nd wife    6th grade education   Reads "pretty good"   3 children    Close by       Enjoys: outside cutting grass        Diet: all food groups    Caffeine: 2 cups daily, sodas sometimes     Water: 6-8 cups daily       Wears seat belt   Smoke detectors    Does not use phone while driving            Social Determinants of Health   Financial Resource Strain: Low Risk  (01/17/2022)   Overall Financial Resource Strain (CARDIA)    Difficulty of Paying Living Expenses: Not hard at all  Food Insecurity: No Food Insecurity (01/17/2022)   Hunger Vital Sign    Worried About Running  Out of Food in the Last Year: Never true    Ran Out of Food in the Last Year: Never true  Transportation Needs: No Transportation Needs (01/17/2022)   PRAPARE - Administrator, Civil Service (Medical): No    Lack of Transportation (Non-Medical): No  Physical Activity: Sufficiently Active (01/07/2021)   Exercise Vital Sign    Days of Exercise per Week: 3 days    Minutes of Exercise per Session: 60 min  Stress: No Stress Concern Present (01/17/2022)   [employer] of Occupational Health - Occupational Stress Questionnaire    Feeling of Stress : Not at all  Social Connections: Moderately Isolated (01/17/2022)   Social Connection and Isolation Panel [NHANES]    Frequency of Communication with Friends and Family: Twice a week    Frequency of Social Gatherings with Friends and Family: Never    Attends Religious Services: 1 to 4 times per year    Active Member of [employer] or Organizations: No    Attends Banker Meetings: Never    Marital Status: Married  Catering manager  Violence: Not At Risk (01/07/2021)   Humiliation, Afraid, Rape, and Kick questionnaire    Fear of Current or Ex-Partner: No    Emotionally Abused: No    Physically Abused: No    Sexually Abused: No    Outpatient Medications Prior to Visit  Medication Sig Dispense Refill   acetaminophen (TYLENOL) 500 MG tablet Take 1 tablet (500 mg total) by mouth every 6 (six) hours as needed. 30 tablet 0   amLODipine-olmesartan (AZOR) 10-40 MG tablet Take 1 tablet by mouth daily. 90 tablet 3   metFORMIN (GLUCOPHAGE) 1000 MG tablet Take 1 tablet (1,000 mg total) by mouth 2 (two) times daily with a meal. 180 tablet 1   omeprazole (PRILOSEC) 40 MG capsule Take 1 capsule (40 mg total) by mouth daily. 30 capsule 3   rosuvastatin (CRESTOR) 40 MG tablet Take 1 tablet (40 mg total) by mouth daily. 90 tablet 3   sildenafil (VIAGRA) 25 MG tablet Take 1 tablet (25 mg total) by mouth daily as needed for erectile dysfunction. 10 tablet 2   No facility-administered medications prior to visit.    No Known Allergies  ROS Review of Systems  Constitutional:  Positive for fatigue. Negative for fever.  Eyes:  Negative for visual disturbance.  Respiratory:  Negative for cough, chest tightness and shortness of breath.   Cardiovascular:  Negative for chest pain and palpitations.  Neurological:  Negative for dizziness and headaches.      Objective:    Physical Exam HENT:     Head: Normocephalic.     Right Ear: External ear normal.     Left Ear: External ear normal.     Nose: No congestion or rhinorrhea.     Mouth/Throat:     Mouth: Mucous membranes are dry.  Cardiovascular:     Rate and Rhythm: Regular rhythm.     Heart sounds: No murmur heard. Pulmonary:     Effort: No respiratory distress.     Breath sounds: Normal breath sounds.  Neurological:     Mental Status: He is alert.     BP 138/73   Pulse 97   Ht 5\' 10"  (1.778 m)   Wt 159 lb 1.9 oz (72.2 kg)   SpO2 96%   BMI 22.83 kg/m  Wt Readings from  Last 3 Encounters:  09/18/22 159 lb 1.9 oz (72.2 kg)  09/17/22 170 lb (77.1 kg)  08/24/22 151 lb (68.5 kg)    Lab Results  Component Value Date   TSH 1.070 05/18/2022   Lab Results  Component Value Date   WBC 4.8 09/17/2022   HGB 15.1 09/17/2022   HCT 45.5 09/17/2022   MCV 91.7 09/17/2022   PLT 223 09/17/2022   Lab Results  Component Value Date   NA 134 (L) 09/17/2022   K 3.3 (L) 09/17/2022   CO2 24 09/17/2022   GLUCOSE 241 (H) 09/17/2022   BUN 30 (H) 09/17/2022   CREATININE 2.61 (H) 09/17/2022   BILITOT 0.6 08/24/2022   ALKPHOS 105 08/24/2022   AST 20 08/24/2022   ALT 14 08/24/2022   PROT 7.5 08/24/2022   ALBUMIN 4.2 08/24/2022   CALCIUM 9.0 09/17/2022   ANIONGAP 14 09/17/2022   EGFR 22 (L) 08/24/2022   Lab Results  Component Value Date   CHOL 175 05/18/2022   Lab Results  Component Value Date   HDL 36 (L) 05/18/2022   Lab Results  Component Value Date   LDLCALC 111 (H) 05/18/2022   Lab Results  Component Value Date   TRIG 157 (H) 05/18/2022   Lab Results  Component Value Date   CHOLHDL 4.9 05/18/2022   Lab Results  Component Value Date   HGBA1C 7.0 (H) 08/24/2022      Assessment & Plan:  Syncope and collapse Assessment & Plan: In 1997, the pt was incarcerated for 11 years due to habitual felony offenses. 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 Because of his criminal hx and the firearm possession, he was sentenced to 60 days in prison. After serving 18 days, he appealed to serve the remaining time on weekends due to experiencing illness, poor nutritional intake, and fatigue while incarcerated. He has requested a doctor's note to support his appeal for house arrest to better manage his nutritional needs. Noted provided Encouraged increasing his fluid intake, at least 64 ounces daily Recommended high protein and calorie diet Encouraged to f/u with pcp in 2 weeks   Note:  This chart has been completed using Engineer, civil (consulting) software, and while attempts have been made to ensure accuracy, certain words and phrases may not be transcribed as intended.    Follow-up: Return in about 2 weeks (around 10/02/2022).   Gilmore Laroche, FNP

## 2022-09-18 NOTE — Patient Instructions (Addendum)
I appreciate the opportunity to provide care to you today!    Follow up:  2 weeks with PCP  -I recommend increasing your fluid intake of water daily, at least 64 ounces to stay hydration To meet your  daily nutritional intake I recommend a high protein and high calorie diet ?Eat 5 to 6 small meals a day, instead of 3 large meals. You can also choose high-calorie snacks and drinks between meals. ? Keep snacks around that are easy to eat. Try to eat often, even if you don't feel hungry.  Your potassium levels was slightly low, I recommend increasing your dietary intake of potassium rich foods( banana, avocado, potatoes, citrus fruits, spinach)   Other foods or drinks - Drink high-protein nutritional supplement drinks (sample brand names: Ensure, Boost, [employer]).     Attached with your AVS, you will find valuable resources for self-education. I highly recommend dedicating some time to thoroughly examine them.   Please continue to a heart-healthy diet and increase your physical activities. Try to exercise for at least five days a week.    It was a pleasure to see you and I look forward to continuing to work together on your health and well-being. Please do not hesitate to call the office if you need care or have questions about your care.  In case of emergency, please visit the Emergency Department for urgent care, or contact our clinic at [phone] to schedule an appointment. We're here to help you!   Have a wonderful day and week. With Gratitude, Gilmore Laroche MSN, FNP-BC

## 2022-09-18 NOTE — Telephone Encounter (Signed)
Daughter called in on patient called in on patient behalf .  Wants to re look father diet for jail visits. This past weekend patient came hone dehydrated and had to go to the ER.  Wants to see if provider can write a letter stating that patients body cannot handle it and need to be home to stay his proper diet.  Also blood work shows signs of kidney damage from the dehydration.  Daughter are concerned  and wants a call back in regard.  Pt Er follow up this afternoon @ 3pm

## 2022-09-20 ENCOUNTER — Ambulatory Visit: Payer: Medicare HMO | Admitting: Internal Medicine

## 2022-09-21 ENCOUNTER — Telehealth: Payer: Self-pay

## 2022-09-21 ENCOUNTER — Ambulatory Visit: Payer: Medicare HMO | Admitting: Internal Medicine

## 2022-09-21 NOTE — Telephone Encounter (Signed)
Transition Care Management Follow-up Telephone Call Date of discharge and from where: [facility] 7/13 How have you been since you were released from the hospital? Doing well Any questions or concerns? No  Items Reviewed: Did the pt receive and understand the discharge instructions provided? Yes  Medications obtained and verified? Yes  Other? No  Any new allergies since your discharge? No  Dietary orders reviewed? No Do you have support at home? Yes    Follow up appointments reviewed:  PCP Hospital f/u appt confirmed? Yes  Scheduled to see  on  @ . Specialist Hospital f/u appt confirmed? No  Scheduled to see  on  @ . Are transportation arrangements needed? No  If their condition worsens, is the pt aware to call PCP or go to the Emergency Dept.? Yes Was the patient provided with contact information for the PCP's office or ED? Yes Was to pt encouraged to call back with questions or concerns? Yes

## 2022-10-09 ENCOUNTER — Ambulatory Visit: Payer: Medicare HMO | Admitting: Internal Medicine

## 2022-10-12 ENCOUNTER — Other Ambulatory Visit: Payer: Self-pay | Admitting: Internal Medicine

## 2022-10-12 DIAGNOSIS — I10 Essential (primary) hypertension: Secondary | ICD-10-CM

## 2022-10-16 ENCOUNTER — Encounter: Payer: Self-pay | Admitting: Internal Medicine

## 2022-10-16 ENCOUNTER — Ambulatory Visit: Payer: Medicare HMO | Admitting: Internal Medicine

## 2022-10-16 VITALS — BP 142/62 | HR 83 | Ht 70.0 in | Wt 166.2 lb

## 2022-10-16 DIAGNOSIS — I10 Essential (primary) hypertension: Secondary | ICD-10-CM

## 2022-10-16 DIAGNOSIS — N179 Acute kidney failure, unspecified: Secondary | ICD-10-CM | POA: Diagnosis not present

## 2022-10-16 DIAGNOSIS — K219 Gastro-esophageal reflux disease without esophagitis: Secondary | ICD-10-CM | POA: Diagnosis not present

## 2022-10-16 DIAGNOSIS — E1169 Type 2 diabetes mellitus with other specified complication: Secondary | ICD-10-CM

## 2022-10-16 DIAGNOSIS — E440 Moderate protein-calorie malnutrition: Secondary | ICD-10-CM

## 2022-10-16 DIAGNOSIS — Z7984 Long term (current) use of oral hypoglycemic drugs: Secondary | ICD-10-CM | POA: Diagnosis not present

## 2022-10-16 MED ORDER — AMLODIPINE BESYLATE 5 MG PO TABS: 5.0000 mg | ORAL_TABLET | Freq: Every day | ORAL | 3 refills | Status: DC

## 2022-10-16 NOTE — Patient Instructions (Signed)
Please start taking Amlodipine 5 mg as prescribed. Do not take Amlodipine-Olmesartan.  Please continue taking Metformin as prescribed.  Please continue to take at least 64 ounces of fluid in a day.

## 2022-10-16 NOTE — Assessment & Plan Note (Addendum)
BP Readings from Last 1 Encounters:  10/16/22 (!) 142/62   Uncontrolled, as he was told to stop Azor due to hypotension and AKI Was on Azor 10-40 mg QD, but do not think he would need that now, started amlodipine 5 mg QD now Counseled for compliance with the medications Advised DASH diet and moderate exercise/walking, at least 150 mins/week

## 2022-10-16 NOTE — Assessment & Plan Note (Addendum)
Lab Results  Component Value Date   HGBA1C 7.0 (H) 08/24/2022   Was uncontrolled due to diet noncompliance, but had not been eating lately - would avoid changing his regimen for now On metformin 1000 mg twice daily Advised to follow diabetic diet On statin and ARB F/u CMP and HbA1c Diabetic eye exam: Advised to follow up with Ophthalmology for diabetic eye exam

## 2022-10-20 DIAGNOSIS — N179 Acute kidney failure, unspecified: Secondary | ICD-10-CM | POA: Insufficient documentation

## 2022-10-20 NOTE — Assessment & Plan Note (Signed)
Likely due to dehydration, had also syncope Blood tests from the ER visit reviewed Recheck BMP Had ARB, restart amlodipine at lower dose Advised to maintain adequate hydration

## 2022-10-20 NOTE — Assessment & Plan Note (Signed)
BMI Readings from Last 3 Encounters:  10/16/22 23.85 kg/m  09/18/22 22.83 kg/m  09/17/22 24.39 kg/m   Had about 27 lbs weight loss in 3 months, but now improving since getting home Likely due to poor p.o. intake in the low enforcement facility Needs to take protein supplements and eat at regular intervals

## 2022-10-20 NOTE — Progress Notes (Signed)
Established Patient Office Visit  Subjective:  Patient ID: Jerry West, male    DOB: March 30, 1934  Age: 88 y.o. MRN: [id number]  CC:  Chief Complaint  Patient presents with   Dehydration    Two week follow up     HPI Jerry West is a 88 y.o. male with past medical history of HTN, type 2 DM and HLD who presents for f/u of his chronic medical conditions.  His daughter is present during the visit today.  He came out from prison/low enforcement facility recently.  He was not tolerating the food inside the facility. He had lost about 27 lbs, but has started gaining weight again since the last visit.  He denies loss of appetite, but reports that he had epigastric burning after eating in the facility.  He has been taking omeprazole for GERD now.  BP is elevated today.  Of note, he had an episode of syncope at home due to dehydration and was brought to ER.  He was given IV fluids and was told to stop taking Azor.  Patient denies headache, dizziness, chest pain, dyspnea or palpitations.  Type II DM: He takes metformin for it.  Denies any fatigue, polyuria or polydipsia.  His HbA1c was 8.8 in 03/24.   HLD: Takes Crestor.   He denies taking any vaccine.  Past Medical History:  Diagnosis Date   Alcoholism in remission (HCC) 12/19/2016   Cancer (HCC)    prostate   Cataract    Diabetes mellitus    Type II, denies being a diabetic at this time   Elevated blood sugar 12/19/2016   History of prostate cancer 12/19/2016   HLD (hyperlipidemia) 01/03/2017   Hypertension    Tobacco abuse 12/19/2016    Past Surgical History:  Procedure Laterality Date   APPENDECTOMY     CHOLECYSTECTOMY     FRACTURE SURGERY      Family History  Problem Relation Age of Onset   Stroke Mother    Alzheimer's disease Father    Alcohol abuse Father    Arthritis Father     Social History   Socioeconomic History   Marital status: Married    Spouse name: Talbert Forest   Number of children: 3    Years of education: 6   Highest education level: Not on file  Occupational History   Occupation: retired    Comment: farming, mill work  Tobacco Use   Smoking status: Every Day    Current packs/day: 0.75    Average packs/day: 0.8 packs/day for 76.6 years (57.5 ttl pk-yrs)    Types: Cigarettes    Start date: 03/06/1946   Smokeless tobacco: Never  Vaping Use   Vaping status: Never Used  Substance and Sexual Activity   Alcohol use: No    Comment: prior alcoholic stopped in 1997   Drug use: No   Sexual activity: Not Currently  Other Topics Concern   Not on file  Social History Narrative   Lives with Talbert Forest 2nd wife    6th grade education   Reads "pretty good"   3 children    Close by       Enjoys: outside cutting grass        Diet: all food groups    Caffeine: 2 cups daily, sodas sometimes     Water: 6-8 cups daily       Wears seat belt   Smoke detectors    Does not use phone while driving  Social Determinants of Health   Financial Resource Strain: Low Risk  (01/17/2022)   Overall Financial Resource Strain (CARDIA)    Difficulty of Paying Living Expenses: Not hard at all  Food Insecurity: No Food Insecurity (01/17/2022)   Hunger Vital Sign    Worried About Running Out of Food in the Last Year: Never true    Ran Out of Food in the Last Year: Never true  Transportation Needs: No Transportation Needs (01/17/2022)   PRAPARE - Administrator, Civil Service (Medical): No    Lack of Transportation (Non-Medical): No  Physical Activity: Sufficiently Active (01/07/2021)   Exercise Vital Sign    Days of Exercise per Week: 3 days    Minutes of Exercise per Session: 60 min  Stress: No Stress Concern Present (01/17/2022)   [employer] of Occupational Health - Occupational Stress Questionnaire    Feeling of Stress : Not at all  Social Connections: Moderately Isolated (01/17/2022)   Social Connection and Isolation Panel [NHANES]    Frequency of  Communication with Friends and Family: Twice a week    Frequency of Social Gatherings with Friends and Family: Never    Attends Religious Services: 1 to 4 times per year    Active Member of [employer] or Organizations: No    Attends Banker Meetings: Never    Marital Status: Married  Catering manager Violence: Not At Risk (01/07/2021)   Humiliation, Afraid, Rape, and Kick questionnaire    Fear of Current or Ex-Partner: No    Emotionally Abused: No    Physically Abused: No    Sexually Abused: No    Outpatient Medications Prior to Visit  Medication Sig Dispense Refill   acetaminophen (TYLENOL) 500 MG tablet Take 1 tablet (500 mg total) by mouth every 6 (six) hours as needed. 30 tablet 0   metFORMIN (GLUCOPHAGE) 1000 MG tablet Take 1 tablet (1,000 mg total) by mouth 2 (two) times daily with a meal. 180 tablet 1   omeprazole (PRILOSEC) 40 MG capsule Take 1 capsule (40 mg total) by mouth daily. 30 capsule 3   rosuvastatin (CRESTOR) 40 MG tablet Take 1 tablet (40 mg total) by mouth daily. 90 tablet 3   sildenafil (VIAGRA) 25 MG tablet Take 1 tablet (25 mg total) by mouth daily as needed for erectile dysfunction. 10 tablet 2   amLODipine-olmesartan (AZOR) 10-40 MG tablet TAKE 1 TABLET BY MOUTH EVERY DAY 90 tablet 3   No facility-administered medications prior to visit.    No Known Allergies  ROS Review of Systems  Constitutional:  Positive for unexpected weight change. Negative for chills and fever.  HENT:  Negative for congestion and sore throat.   Eyes:  Negative for pain and discharge.  Respiratory:  Negative for cough and shortness of breath.   Cardiovascular:  Negative for chest pain and palpitations.  Gastrointestinal:  Negative for constipation, diarrhea, nausea and vomiting.  Endocrine: Negative for polydipsia and polyuria.  Genitourinary:  Negative for dysuria and hematuria.  Musculoskeletal:  Negative for neck pain and neck stiffness.  Skin:  Negative for rash.   Neurological:  Negative for dizziness, weakness, numbness and headaches.  Psychiatric/Behavioral:  Positive for decreased concentration and sleep disturbance. Negative for agitation and behavioral problems.       Objective:    Physical Exam Vitals reviewed.  Constitutional:      General: He is not in acute distress.    Appearance: He is not diaphoretic.  HENT:  Head: Normocephalic and atraumatic.     Nose: Nose normal.     Mouth/Throat:     Mouth: Mucous membranes are moist.  Eyes:     General: No scleral icterus.    Extraocular Movements: Extraocular movements intact.  Cardiovascular:     Rate and Rhythm: Normal rate and regular rhythm.     Pulses: Normal pulses.     Heart sounds: Normal heart sounds. No murmur heard. Pulmonary:     Breath sounds: Normal breath sounds. No wheezing or rales.  Musculoskeletal:     Cervical back: Neck supple. No tenderness.     Right lower leg: No edema.     Left lower leg: No edema.  Skin:    General: Skin is warm.     Findings: No rash.  Neurological:     General: No focal deficit present.     Mental Status: He is alert and oriented to person, place, and time.     Sensory: No sensory deficit.     Motor: No weakness.  Psychiatric:        Mood and Affect: Mood normal.        Behavior: Behavior normal.     BP (!) 142/62 (BP Location: Left Arm)   Pulse 83   Ht 5\' 10"  (1.778 m)   Wt 166 lb 3.2 oz (75.4 kg)   SpO2 95%   BMI 23.85 kg/m  Wt Readings from Last 3 Encounters:  10/16/22 166 lb 3.2 oz (75.4 kg)  09/18/22 159 lb 1.9 oz (72.2 kg)  09/17/22 170 lb (77.1 kg)    Lab Results  Component Value Date   TSH 1.070 05/18/2022   Lab Results  Component Value Date   WBC 4.8 09/17/2022   HGB 15.1 09/17/2022   HCT 45.5 09/17/2022   MCV 91.7 09/17/2022   PLT 223 09/17/2022   Lab Results  Component Value Date   NA 143 10/16/2022   K 3.7 10/16/2022   CO2 27 10/16/2022   GLUCOSE 110 (H) 10/16/2022   BUN 10 10/16/2022    CREATININE 1.33 (H) 10/16/2022   BILITOT 0.6 08/24/2022   ALKPHOS 105 08/24/2022   AST 20 08/24/2022   ALT 14 08/24/2022   PROT 7.5 08/24/2022   ALBUMIN 4.2 08/24/2022   CALCIUM 9.0 10/16/2022   ANIONGAP 14 09/17/2022   EGFR 52 (L) 10/16/2022   Lab Results  Component Value Date   CHOL 175 05/18/2022   Lab Results  Component Value Date   HDL 36 (L) 05/18/2022   Lab Results  Component Value Date   LDLCALC 111 (H) 05/18/2022   Lab Results  Component Value Date   TRIG 157 (H) 05/18/2022   Lab Results  Component Value Date   CHOLHDL 4.9 05/18/2022   Lab Results  Component Value Date   HGBA1C 7.0 (H) 08/24/2022      Assessment & Plan:   Problem List Items Addressed This Visit       Cardiovascular and Mediastinum   Essential hypertension - Primary    BP Readings from Last 1 Encounters:  10/16/22 (!) 142/62   Uncontrolled, as he was told to stop Azor due to hypotension and AKI Was on Azor 10-40 mg QD, but do not think he would need that now, started amlodipine 5 mg QD now Counseled for compliance with the medications Advised DASH diet and moderate exercise/walking, at least 150 mins/week      Relevant Medications   amLODipine (NORVASC) 5 MG tablet   Other  Relevant Orders   Basic Metabolic Panel (BMET) (Completed)   Magnesium (Completed)     Digestive   Gastroesophageal reflux disease    His epigastric pain could be due to gastritis On omeprazole now Avoid hot and spicy food        Endocrine   Type 2 diabetes mellitus with other specified complication (HCC)    Lab Results  Component Value Date   HGBA1C 7.0 (H) 08/24/2022   Was uncontrolled due to diet noncompliance, but had not been eating lately - would avoid changing his regimen for now On metformin 1000 mg twice daily Advised to follow diabetic diet On statin and ARB F/u CMP and HbA1c Diabetic eye exam: Advised to follow up with Ophthalmology for diabetic eye exam      Relevant Orders    Basic Metabolic Panel (BMET) (Completed)     Genitourinary   AKI (acute kidney injury) (HCC)    Likely due to dehydration, had also syncope Blood tests from the ER visit reviewed Recheck BMP Had ARB, restart amlodipine at lower dose Advised to maintain adequate hydration      Relevant Orders   Basic Metabolic Panel (BMET) (Completed)   Magnesium (Completed)     Other   Moderate protein-calorie malnutrition (HCC)    BMI Readings from Last 3 Encounters:  10/16/22 23.85 kg/m  09/18/22 22.83 kg/m  09/17/22 24.39 kg/m   Had about 27 lbs weight loss in 3 months, but now improving since getting home Likely due to poor p.o. intake in the low enforcement facility Needs to take protein supplements and eat at regular intervals      Meds ordered this encounter  Medications   amLODipine (NORVASC) 5 MG tablet    Sig: Take 1 tablet (5 mg total) by mouth daily.    Dispense:  30 tablet    Refill:  3    Discontinue Amlodipine-Olmesartan.    Follow-up: Return in about 3 months (around 01/16/2023) for HTN.    Anabel Halon, MD

## 2022-10-20 NOTE — Assessment & Plan Note (Signed)
His epigastric pain could be due to gastritis On omeprazole now Avoid hot and spicy food

## 2022-10-25 ENCOUNTER — Other Ambulatory Visit: Payer: Self-pay | Admitting: Internal Medicine

## 2022-10-25 DIAGNOSIS — E782 Mixed hyperlipidemia: Secondary | ICD-10-CM

## 2022-11-18 ENCOUNTER — Other Ambulatory Visit: Payer: Self-pay | Admitting: Internal Medicine

## 2022-11-18 DIAGNOSIS — E1169 Type 2 diabetes mellitus with other specified complication: Secondary | ICD-10-CM

## 2022-11-24 ENCOUNTER — Ambulatory Visit: Payer: Medicare HMO | Admitting: Internal Medicine

## 2022-12-23 ENCOUNTER — Other Ambulatory Visit: Payer: Self-pay | Admitting: Internal Medicine

## 2022-12-23 DIAGNOSIS — K219 Gastro-esophageal reflux disease without esophagitis: Secondary | ICD-10-CM

## 2023-01-16 ENCOUNTER — Ambulatory Visit (INDEPENDENT_AMBULATORY_CARE_PROVIDER_SITE_OTHER): Payer: Medicare HMO | Admitting: Internal Medicine

## 2023-01-16 ENCOUNTER — Encounter: Payer: Self-pay | Admitting: Internal Medicine

## 2023-01-16 VITALS — BP 174/76 | HR 85 | Ht 70.0 in | Wt 171.6 lb

## 2023-01-16 DIAGNOSIS — E1169 Type 2 diabetes mellitus with other specified complication: Secondary | ICD-10-CM

## 2023-01-16 DIAGNOSIS — I10 Essential (primary) hypertension: Secondary | ICD-10-CM | POA: Diagnosis not present

## 2023-01-16 DIAGNOSIS — E782 Mixed hyperlipidemia: Secondary | ICD-10-CM | POA: Diagnosis not present

## 2023-01-16 DIAGNOSIS — Z7984 Long term (current) use of oral hypoglycemic drugs: Secondary | ICD-10-CM | POA: Diagnosis not present

## 2023-01-16 DIAGNOSIS — K219 Gastro-esophageal reflux disease without esophagitis: Secondary | ICD-10-CM

## 2023-01-16 MED ORDER — AMLODIPINE-OLMESARTAN 10-40 MG PO TABS: 1.0000 | ORAL_TABLET | Freq: Every day | ORAL | 1 refills | Status: DC

## 2023-01-16 NOTE — Assessment & Plan Note (Signed)
On Crestor 40 mg QD Check lipid profile

## 2023-01-16 NOTE — Assessment & Plan Note (Addendum)
BP Readings from Last 1 Encounters:  01/16/23 (!) 187/76   Uncontrolled with Amlodipine 5 mg once daily - likely has run out, as he was told to stop Azor due to hypotension and AKI in the past Was on Azor 10-40 mg QD, restart Azor 10-40 mg once daily now Counseled for compliance with the medications Advised DASH diet and moderate exercise/walking, at least 150 mins/week

## 2023-01-16 NOTE — Progress Notes (Signed)
Established Patient Office Visit  Subjective:  Patient ID: Jerry West, male    DOB: September 03, 1934  Age: 88 y.o. MRN: [id number]  CC:  Chief Complaint  Patient presents with   Hypertension    Three month follow up     HPI Jerry West is a 88 y.o. male with past medical history of HTN, type 2 DM and HLD who presents for f/u of his chronic medical conditions.  His daughter is present during the visit today.  He came out from prison/low enforcement facility recently.  He was not tolerating the food inside the facility. He had lost about 27 lbs, but has started gaining weight again.  He denies loss of appetite, but reports that he had epigastric burning after eating in the facility.  He has been taking omeprazole for GERD now.  BP is elevated today.  He used to take Azor, which was discontinued after ER visit due to AKI and dehydration.  He had recently returned  from prison at that time.  His appetite and dietary habits have returned to baseline now.  Patient denies headache, dizziness, chest pain, dyspnea or palpitations.  Type II DM: He takes metformin for it.  Denies any fatigue, polyuria or polydipsia.  His HbA1c was 7.0 in 06/24.   HLD: Takes Crestor.  He denies taking any vaccine.  Past Medical History:  Diagnosis Date   Alcoholism in remission (HCC) 12/19/2016   Cancer (HCC)    prostate   Cataract    Diabetes mellitus    Type II, denies being a diabetic at this time   Elevated blood sugar 12/19/2016   History of prostate cancer 12/19/2016   HLD (hyperlipidemia) 01/03/2017   Hypertension    Tobacco abuse 12/19/2016    Past Surgical History:  Procedure Laterality Date   APPENDECTOMY     CHOLECYSTECTOMY     FRACTURE SURGERY      Family History  Problem Relation Age of Onset   Stroke Mother    Alzheimer's disease Father    Alcohol abuse Father    Arthritis Father     Social History   Socioeconomic History   Marital status: Married     Spouse name: Talbert Forest   Number of children: 3   Years of education: 6   Highest education level: Not on file  Occupational History   Occupation: retired    Comment: farming, mill work  Tobacco Use   Smoking status: Every Day    Current packs/day: 0.75    Average packs/day: 0.8 packs/day for 76.9 years (57.6 ttl pk-yrs)    Types: Cigarettes    Start date: 03/06/1946   Smokeless tobacco: Never  Vaping Use   Vaping status: Never Used  Substance and Sexual Activity   Alcohol use: No    Comment: prior alcoholic stopped in 1997   Drug use: No   Sexual activity: Not Currently  Other Topics Concern   Not on file  Social History Narrative   Lives with Talbert Forest 2nd wife    6th grade education   Reads "pretty good"   3 children    Close by       Enjoys: outside cutting grass        Diet: all food groups    Caffeine: 2 cups daily, sodas sometimes     Water: 6-8 cups daily       Wears seat belt   Smoke detectors    Does not use phone while driving  Social Determinants of Health   Financial Resource Strain: Low Risk  (01/17/2022)   Overall Financial Resource Strain (CARDIA)    Difficulty of Paying Living Expenses: Not hard at all  Food Insecurity: No Food Insecurity (01/17/2022)   Hunger Vital Sign    Worried About Running Out of Food in the Last Year: Never true    Ran Out of Food in the Last Year: Never true  Transportation Needs: No Transportation Needs (01/17/2022)   PRAPARE - Administrator, Civil Service (Medical): No    Lack of Transportation (Non-Medical): No  Physical Activity: Sufficiently Active (01/07/2021)   Exercise Vital Sign    Days of Exercise per Week: 3 days    Minutes of Exercise per Session: 60 min  Stress: No Stress Concern Present (01/17/2022)   [employer] of Occupational Health - Occupational Stress Questionnaire    Feeling of Stress : Not at all  Social Connections: Moderately Isolated (01/17/2022)   Social Connection  and Isolation Panel [NHANES]    Frequency of Communication with Friends and Family: Twice a week    Frequency of Social Gatherings with Friends and Family: Never    Attends Religious Services: 1 to 4 times per year    Active Member of [employer] or Organizations: No    Attends Banker Meetings: Never    Marital Status: Married  Catering manager Violence: Not At Risk (01/07/2021)   Humiliation, Afraid, Rape, and Kick questionnaire    Fear of Current or Ex-Partner: No    Emotionally Abused: No    Physically Abused: No    Sexually Abused: No    Outpatient Medications Prior to Visit  Medication Sig Dispense Refill   acetaminophen (TYLENOL) 500 MG tablet Take 1 tablet (500 mg total) by mouth every 6 (six) hours as needed. 30 tablet 0   metFORMIN (GLUCOPHAGE) 1000 MG tablet TAKE 1 TABLET (1,000 MG TOTAL) BY MOUTH TWICE A DAY WITH FOOD 180 tablet 1   omeprazole (PRILOSEC) 40 MG capsule TAKE 1 CAPSULE (40 MG TOTAL) BY MOUTH DAILY. 30 capsule 3   rosuvastatin (CRESTOR) 40 MG tablet Take 1 tablet (40 mg total) by mouth daily. 90 tablet 3   sildenafil (VIAGRA) 25 MG tablet Take 1 tablet (25 mg total) by mouth daily as needed for erectile dysfunction. 10 tablet 2   amLODipine (NORVASC) 5 MG tablet Take 1 tablet (5 mg total) by mouth daily. 30 tablet 3   rosuvastatin (CRESTOR) 20 MG tablet TAKE 1 TABLET BY MOUTH EVERY DAY 90 tablet 3   No facility-administered medications prior to visit.    No Known Allergies  ROS Review of Systems  Constitutional:  Negative for chills and fever.  HENT:  Negative for congestion and sore throat.   Eyes:  Negative for pain and discharge.  Respiratory:  Negative for cough and shortness of breath.   Cardiovascular:  Negative for chest pain and palpitations.  Gastrointestinal:  Negative for diarrhea, nausea and vomiting.  Endocrine: Negative for polydipsia and polyuria.  Genitourinary:  Negative for dysuria and hematuria.  Musculoskeletal:  Negative for  neck pain and neck stiffness.  Skin:  Negative for rash.  Neurological:  Negative for dizziness, weakness, numbness and headaches.  Psychiatric/Behavioral:  Positive for decreased concentration and sleep disturbance. Negative for agitation and behavioral problems.       Objective:    Physical Exam Vitals reviewed.  Constitutional:      General: He is not in acute distress.  Appearance: He is not diaphoretic.  HENT:     Head: Normocephalic and atraumatic.     Nose: Nose normal.     Mouth/Throat:     Mouth: Mucous membranes are moist.  Eyes:     General: No scleral icterus.    Extraocular Movements: Extraocular movements intact.  Cardiovascular:     Rate and Rhythm: Normal rate and regular rhythm.     Pulses: Normal pulses.     Heart sounds: Normal heart sounds. No murmur heard. Pulmonary:     Breath sounds: Normal breath sounds. No wheezing or rales.  Musculoskeletal:     Cervical back: Neck supple. No tenderness.     Right lower leg: No edema.     Left lower leg: No edema.  Skin:    General: Skin is warm.     Findings: No rash.  Neurological:     General: No focal deficit present.     Mental Status: He is alert and oriented to person, place, and time.     Sensory: No sensory deficit.     Motor: No weakness.  Psychiatric:        Mood and Affect: Mood normal.        Behavior: Behavior normal.     BP (!) 174/76 (BP Location: Right Arm)   Pulse 85   Ht 5\' 10"  (1.778 m)   Wt 171 lb 9.6 oz (77.8 kg)   SpO2 93%   BMI 24.62 kg/m  Wt Readings from Last 3 Encounters:  01/16/23 171 lb 9.6 oz (77.8 kg)  10/16/22 166 lb 3.2 oz (75.4 kg)  09/18/22 159 lb 1.9 oz (72.2 kg)    Lab Results  Component Value Date   TSH 1.070 05/18/2022   Lab Results  Component Value Date   WBC 4.8 09/17/2022   HGB 15.1 09/17/2022   HCT 45.5 09/17/2022   MCV 91.7 09/17/2022   PLT 223 09/17/2022   Lab Results  Component Value Date   NA 143 10/16/2022   K 3.7 10/16/2022   CO2 27  10/16/2022   GLUCOSE 110 (H) 10/16/2022   BUN 10 10/16/2022   CREATININE 1.33 (H) 10/16/2022   BILITOT 0.6 08/24/2022   ALKPHOS 105 08/24/2022   AST 20 08/24/2022   ALT 14 08/24/2022   PROT 7.5 08/24/2022   ALBUMIN 4.2 08/24/2022   CALCIUM 9.0 10/16/2022   ANIONGAP 14 09/17/2022   EGFR 52 (L) 10/16/2022   Lab Results  Component Value Date   CHOL 175 05/18/2022   Lab Results  Component Value Date   HDL 36 (L) 05/18/2022   Lab Results  Component Value Date   LDLCALC 111 (H) 05/18/2022   Lab Results  Component Value Date   TRIG 157 (H) 05/18/2022   Lab Results  Component Value Date   CHOLHDL 4.9 05/18/2022   Lab Results  Component Value Date   HGBA1C 7.0 (H) 08/24/2022      Assessment & Plan:   Problem List Items Addressed This Visit       Cardiovascular and Mediastinum   Essential hypertension - Primary    BP Readings from Last 1 Encounters:  01/16/23 (!) 187/76   Uncontrolled with Amlodipine 5 mg once daily - likely has run out, as he was told to stop Azor due to hypotension and AKI in the past Was on Azor 10-40 mg QD, restart Azor 10-40 mg once daily now Counseled for compliance with the medications Advised DASH diet and moderate exercise/walking, at least  150 mins/week      Relevant Medications   amLODipine-olmesartan (AZOR) 10-40 MG tablet     Digestive   Gastroesophageal reflux disease    Well-controlled On omeprazole now Avoid hot and spicy food        Endocrine   Type 2 diabetes mellitus with other specified complication (HCC)    Lab Results  Component Value Date   HGBA1C 7.0 (H) 08/24/2022   Was uncontrolled due to diet noncompliance, but had not been eating lately - would avoid changing his regimen for now Associated with HTN and HLD On metformin 1000 mg twice daily Advised to follow diabetic diet On statin and ARB F/u CMP and HbA1c Diabetic eye exam: Advised to follow up with Ophthalmology for diabetic eye exam      Relevant  Medications   amLODipine-olmesartan (AZOR) 10-40 MG tablet   Other Relevant Orders   CMP14+EGFR   Hemoglobin A1c     Other   HLD (hyperlipidemia)    On Crestor 40 mg QD Check lipid profile      Relevant Medications   amLODipine-olmesartan (AZOR) 10-40 MG tablet   Other Relevant Orders   Lipid Profile    Meds ordered this encounter  Medications   amLODipine-olmesartan (AZOR) 10-40 MG tablet    Sig: Take 1 tablet by mouth daily.    Dispense:  30 tablet    Refill:  1    Follow-up: Return in about 4 weeks (around 02/13/2023) for HTN and DM.    Anabel Halon, MD

## 2023-01-16 NOTE — Assessment & Plan Note (Signed)
Well-controlled On omeprazole now Avoid hot and spicy food

## 2023-01-16 NOTE — Patient Instructions (Addendum)
Please start taking Amlodipine-Olmesartan as prescribed.  Please take Rosuvastatin 40 mg once daily.  Please follow low carb diet and perform moderate exercise/walking at least 150 mins/week.  Please get blood tests done before the next visit.

## 2023-01-16 NOTE — Assessment & Plan Note (Addendum)
Lab Results  Component Value Date   HGBA1C 7.0 (H) 08/24/2022   Was uncontrolled due to diet noncompliance, but had not been eating lately - would avoid changing his regimen for now Associated with HTN and HLD On metformin 1000 mg twice daily Advised to follow diabetic diet On statin and ARB F/u CMP and HbA1c Diabetic eye exam: Advised to follow up with Ophthalmology for diabetic eye exam

## 2023-02-17 NOTE — Patient Instructions (Signed)

## 2023-02-17 NOTE — Progress Notes (Unsigned)
   Established Patient Office Visit   Subjective  Patient ID: Jerry West, male    DOB: [birth year]  Age: 88 y.o. MRN: [id number]  No chief complaint on file.   He  has a past medical history of Alcoholism in remission (HCC) (12/19/2016), Cancer (HCC), Cataract, Diabetes mellitus, Elevated blood sugar (12/19/2016), History of prostate cancer (12/19/2016), HLD (hyperlipidemia) (01/03/2017), Hypertension, and Tobacco abuse (12/19/2016).  HPI  ROS    Objective:     There were no vitals taken for this visit. {Vitals History (Optional):23777}  Physical Exam   No results found for any visits on 02/19/23.  The ASCVD Risk score (Arnett DK, et al., 2019) failed to calculate for the following reasons:   The 2019 ASCVD risk score is only valid for ages 56 to 37    Assessment & Plan:  There are no diagnoses linked to this encounter.  No follow-ups on file.   Cruzita Lederer Newman Nip, FNP

## 2023-02-19 ENCOUNTER — Ambulatory Visit (INDEPENDENT_AMBULATORY_CARE_PROVIDER_SITE_OTHER): Payer: Medicare HMO | Admitting: Family Medicine

## 2023-02-19 ENCOUNTER — Ambulatory Visit: Payer: Medicare HMO | Admitting: Internal Medicine

## 2023-02-19 DIAGNOSIS — I10 Essential (primary) hypertension: Secondary | ICD-10-CM

## 2023-02-19 DIAGNOSIS — E119 Type 2 diabetes mellitus without complications: Secondary | ICD-10-CM

## 2023-02-19 NOTE — Progress Notes (Signed)
NO SHOW

## 2023-02-21 ENCOUNTER — Other Ambulatory Visit: Payer: Self-pay | Admitting: Internal Medicine

## 2023-02-21 DIAGNOSIS — I10 Essential (primary) hypertension: Secondary | ICD-10-CM

## 2023-02-21 DIAGNOSIS — E782 Mixed hyperlipidemia: Secondary | ICD-10-CM | POA: Diagnosis not present

## 2023-02-21 DIAGNOSIS — E1169 Type 2 diabetes mellitus with other specified complication: Secondary | ICD-10-CM | POA: Diagnosis not present

## 2023-02-22 LAB — CMP14+EGFR
ALT: 7 [IU]/L (ref 0–44)
AST: 11 [IU]/L (ref 0–40)
Albumin: 4.2 g/dL (ref 3.7–4.7)
Alkaline Phosphatase: 114 [IU]/L (ref 44–121)
BUN/Creatinine Ratio: 6 — ABNORMAL LOW (ref 10–24)
BUN: 16 mg/dL (ref 8–27)
Bilirubin Total: 0.6 mg/dL (ref 0.0–1.2)
CO2: 25 mmol/L (ref 20–29)
Calcium: 9.4 mg/dL (ref 8.6–10.2)
Chloride: 102 mmol/L (ref 96–106)
Creatinine, Ser: 2.65 mg/dL — ABNORMAL HIGH (ref 0.76–1.27)
Globulin, Total: 3.3 g/dL (ref 1.5–4.5)
Glucose: 110 mg/dL — ABNORMAL HIGH (ref 70–99)
Potassium: 3.6 mmol/L (ref 3.5–5.2)
Sodium: 145 mmol/L — ABNORMAL HIGH (ref 134–144)
Total Protein: 7.5 g/dL (ref 6.0–8.5)
eGFR: 23 mL/min/{1.73_m2} — ABNORMAL LOW (ref >59)

## 2023-02-22 LAB — LIPID PANEL
Chol/HDL Ratio: 3.3 {ratio} (ref 0.0–5.0)
Cholesterol, Total: 110 mg/dL (ref 100–199)
HDL: 33 mg/dL — ABNORMAL LOW (ref >39)
LDL Chol Calc (NIH): 55 mg/dL (ref 0–99)
Triglycerides: 123 mg/dL (ref 0–149)
VLDL Cholesterol Cal: 22 mg/dL (ref 5–40)

## 2023-02-22 LAB — HEMOGLOBIN A1C
Est. average glucose Bld gHb Est-mCnc: 197 mg/dL
Hgb A1c MFr Bld: 8.5 % — ABNORMAL HIGH (ref 4.8–5.6)

## 2023-03-01 ENCOUNTER — Ambulatory Visit (INDEPENDENT_AMBULATORY_CARE_PROVIDER_SITE_OTHER): Payer: Medicare HMO | Admitting: Internal Medicine

## 2023-03-01 ENCOUNTER — Encounter: Payer: Self-pay | Admitting: Internal Medicine

## 2023-03-01 VITALS — BP 133/78 | HR 90 | Ht 70.0 in | Wt 168.8 lb

## 2023-03-01 DIAGNOSIS — E1169 Type 2 diabetes mellitus with other specified complication: Secondary | ICD-10-CM | POA: Diagnosis not present

## 2023-03-01 DIAGNOSIS — Z7984 Long term (current) use of oral hypoglycemic drugs: Secondary | ICD-10-CM | POA: Diagnosis not present

## 2023-03-01 DIAGNOSIS — I10 Essential (primary) hypertension: Secondary | ICD-10-CM

## 2023-03-01 DIAGNOSIS — E440 Moderate protein-calorie malnutrition: Secondary | ICD-10-CM

## 2023-03-01 DIAGNOSIS — N179 Acute kidney failure, unspecified: Secondary | ICD-10-CM

## 2023-03-01 MED ORDER — GLIPIZIDE 5 MG PO TABS: 5.0000 mg | ORAL_TABLET | Freq: Two times a day (BID) | ORAL | 3 refills | Status: DC

## 2023-03-01 NOTE — Progress Notes (Signed)
Established Patient Office Visit  Subjective:  Patient ID: Jerry West, male    DOB: 1934/07/23  Age: 88 y.o. MRN: [id number]  CC:  Chief Complaint  Patient presents with   Hypertension    Five week follow up    Diabetes    Five week follow up     HPI Jerry West is a 88 y.o. male with past medical history of HTN, type 2 DM and HLD who presents for f/u of his chronic medical conditions.  His daughter is present during the visit today.  BP is wnl today.  He is taking Azor 10-40 mg QD regularly.  Patient denies headache, dizziness, chest pain, dyspnea or palpitations.  Type II DM: He takes metformin 1000 mg BID for it.  Denies any fatigue, polyuria or polydipsia.  His HbA1c is 8.5 now, was 7.0 in 06/24. He reports that he is taking 2-3 bottles of soft drinks everyday and has not been drinking water, which has also led to worsening of serum creatinine and GFR.  HLD: Takes Crestor.  He denies taking any vaccine.  Past Medical History:  Diagnosis Date   Alcoholism in remission (HCC) 12/19/2016   Cancer (HCC)    prostate   Cataract    Diabetes mellitus    Type II, denies being a diabetic at this time   Elevated blood sugar 12/19/2016   History of prostate cancer 12/19/2016   HLD (hyperlipidemia) 01/03/2017   Hypertension    Tobacco abuse 12/19/2016    Past Surgical History:  Procedure Laterality Date   APPENDECTOMY     CHOLECYSTECTOMY     FRACTURE SURGERY      Family History  Problem Relation Age of Onset   Stroke Mother    Alzheimer's disease Father    Alcohol abuse Father    Arthritis Father     Social History   Socioeconomic History   Marital status: Married    Spouse name: Talbert Forest   Number of children: 3   Years of education: 6   Highest education level: Not on file  Occupational History   Occupation: retired    Comment: farming, mill work  Tobacco Use   Smoking status: Every Day    Current packs/day: 0.75    Average packs/day:  0.8 packs/day for 77.0 years (57.7 ttl pk-yrs)    Types: Cigarettes    Start date: 03/06/1946   Smokeless tobacco: Never  Vaping Use   Vaping status: Never Used  Substance and Sexual Activity   Alcohol use: No    Comment: prior alcoholic stopped in 1997   Drug use: No   Sexual activity: Not Currently  Other Topics Concern   Not on file  Social History Narrative   Lives with Talbert Forest 2nd wife    6th grade education   Reads "pretty good"   3 children    Close by       Enjoys: outside cutting grass        Diet: all food groups    Caffeine: 2 cups daily, sodas sometimes     Water: 6-8 cups daily       Wears seat belt   Smoke detectors    Does not use phone while driving            Social Drivers of Health   Financial Resource Strain: Low Risk  (01/17/2022)   Overall Financial Resource Strain (CARDIA)    Difficulty of Paying Living Expenses: Not hard at all  Food  Insecurity: No Food Insecurity (01/17/2022)   Hunger Vital Sign    Worried About Running Out of Food in the Last Year: Never true    Ran Out of Food in the Last Year: Never true  Transportation Needs: No Transportation Needs (01/17/2022)   PRAPARE - Administrator, Civil Service (Medical): No    Lack of Transportation (Non-Medical): No  Physical Activity: Sufficiently Active (01/07/2021)   Exercise Vital Sign    Days of Exercise per Week: 3 days    Minutes of Exercise per Session: 60 min  Stress: No Stress Concern Present (01/17/2022)   [employer] of Occupational Health - Occupational Stress Questionnaire    Feeling of Stress : Not at all  Social Connections: Moderately Isolated (01/17/2022)   Social Connection and Isolation Panel [NHANES]    Frequency of Communication with Friends and Family: Twice a week    Frequency of Social Gatherings with Friends and Family: Never    Attends Religious Services: 1 to 4 times per year    Active Member of [employer] or Organizations: No    Attends Tax inspector Meetings: Never    Marital Status: Married  Catering manager Violence: Not At Risk (01/07/2021)   Humiliation, Afraid, Rape, and Kick questionnaire    Fear of Current or Ex-Partner: No    Emotionally Abused: No    Physically Abused: No    Sexually Abused: No    Outpatient Medications Prior to Visit  Medication Sig Dispense Refill   acetaminophen (TYLENOL) 500 MG tablet Take 1 tablet (500 mg total) by mouth every 6 (six) hours as needed. 30 tablet 0   amLODipine-olmesartan (AZOR) 10-40 MG tablet TAKE 1 TABLET BY MOUTH EVERY DAY 30 tablet 1   omeprazole (PRILOSEC) 40 MG capsule TAKE 1 CAPSULE (40 MG TOTAL) BY MOUTH DAILY. 30 capsule 3   rosuvastatin (CRESTOR) 40 MG tablet Take 1 tablet (40 mg total) by mouth daily. 90 tablet 3   sildenafil (VIAGRA) 25 MG tablet Take 1 tablet (25 mg total) by mouth daily as needed for erectile dysfunction. 10 tablet 2   metFORMIN (GLUCOPHAGE) 1000 MG tablet TAKE 1 TABLET (1,000 MG TOTAL) BY MOUTH TWICE A DAY WITH FOOD 180 tablet 1   No facility-administered medications prior to visit.    No Known Allergies  ROS Review of Systems  Constitutional:  Negative for chills and fever.  HENT:  Negative for congestion and sore throat.   Eyes:  Negative for pain and discharge.  Respiratory:  Negative for cough and shortness of breath.   Cardiovascular:  Negative for chest pain and palpitations.  Gastrointestinal:  Negative for diarrhea, nausea and vomiting.  Endocrine: Negative for polydipsia and polyuria.  Genitourinary:  Negative for dysuria and hematuria.  Musculoskeletal:  Negative for neck pain and neck stiffness.  Skin:  Negative for rash.  Neurological:  Negative for dizziness, weakness, numbness and headaches.  Psychiatric/Behavioral:  Positive for decreased concentration and sleep disturbance. Negative for agitation and behavioral problems.       Objective:    Physical Exam Vitals reviewed.  Constitutional:      General: He is not  in acute distress.    Appearance: He is not diaphoretic.  HENT:     Head: Normocephalic and atraumatic.     Nose: Nose normal.     Mouth/Throat:     Mouth: Mucous membranes are moist.  Eyes:     General: No scleral icterus.    Extraocular Movements: Extraocular movements  intact.  Cardiovascular:     Rate and Rhythm: Normal rate and regular rhythm.     Pulses: Normal pulses.     Heart sounds: Normal heart sounds. No murmur heard. Pulmonary:     Breath sounds: Normal breath sounds. No wheezing or rales.  Musculoskeletal:     Cervical back: Neck supple. No tenderness.     Right lower leg: No edema.     Left lower leg: No edema.  Skin:    General: Skin is warm.     Findings: No rash.  Neurological:     General: No focal deficit present.     Mental Status: He is alert and oriented to person, place, and time.     Sensory: No sensory deficit.     Motor: No weakness.  Psychiatric:        Mood and Affect: Mood normal.        Behavior: Behavior normal.     BP 133/78 (BP Location: Right Arm, Patient Position: Sitting, Cuff Size: Normal)   Pulse 90   Ht 5\' 10"  (1.778 m)   Wt 168 lb 12.8 oz (76.6 kg)   SpO2 94%   BMI 24.22 kg/m  Wt Readings from Last 3 Encounters:  03/01/23 168 lb 12.8 oz (76.6 kg)  01/16/23 171 lb 9.6 oz (77.8 kg)  10/16/22 166 lb 3.2 oz (75.4 kg)    Lab Results  Component Value Date   TSH 1.070 05/18/2022   Lab Results  Component Value Date   WBC 4.8 09/17/2022   HGB 15.1 09/17/2022   HCT 45.5 09/17/2022   MCV 91.7 09/17/2022   PLT 223 09/17/2022   Lab Results  Component Value Date   NA 145 (H) 02/21/2023   K 3.6 02/21/2023   CO2 25 02/21/2023   GLUCOSE 110 (H) 02/21/2023   BUN 16 02/21/2023   CREATININE 2.65 (H) 02/21/2023   BILITOT 0.6 02/21/2023   ALKPHOS 114 02/21/2023   AST 11 02/21/2023   ALT 7 02/21/2023   PROT 7.5 02/21/2023   ALBUMIN 4.2 02/21/2023   CALCIUM 9.4 02/21/2023   ANIONGAP 14 09/17/2022   EGFR 23 (L) 02/21/2023    Lab Results  Component Value Date   CHOL 110 02/21/2023   Lab Results  Component Value Date   HDL 33 (L) 02/21/2023   Lab Results  Component Value Date   LDLCALC 55 02/21/2023   Lab Results  Component Value Date   TRIG 123 02/21/2023   Lab Results  Component Value Date   CHOLHDL 3.3 02/21/2023   Lab Results  Component Value Date   HGBA1C 8.5 (H) 02/21/2023      Assessment & Plan:   Problem List Items Addressed This Visit       Cardiovascular and Mediastinum   Essential hypertension   BP Readings from Last 1 Encounters:  03/01/23 133/78   Well-controlled with Azor 10-40 mg once daily Recheck BMP after 2 weeks as his AKI is likely due to poor PO intake of water If persistently worse kidney function, will discontinue ARB Counseled for compliance with the medications Advised DASH diet and moderate exercise/walking, at least 150 mins/week      Relevant Orders   Basic Metabolic Panel (BMET)     Endocrine   Type 2 diabetes mellitus with other specified complication (HCC) - Primary   Lab Results  Component Value Date   HGBA1C 8.5 (H) 02/21/2023   Uncontrolled due to diet noncompliance, needs to cut down on soft drink  intake Associated with HTN and HLD DC metformin 1000 mg twice daily due to GFR less than 30, started glipizide 5 mg twice daily Advised to follow diabetic diet On statin and ARB F/u CMP and HbA1c Diabetic eye exam: Advised to follow up with Ophthalmology for diabetic eye exam      Relevant Medications   glipiZIDE (GLUCOTROL) 5 MG tablet     Genitourinary   AKI (acute kidney injury) (HCC)   Likely due to dehydration, had also syncope previously Recheck BMP after 2 weeks If persistently worse kidney function, will discontinue ARB Advised to maintain adequate hydration        Other   Moderate protein-calorie malnutrition (HCC)   BMI Readings from Last 3 Encounters:  03/01/23 24.22 kg/m  01/16/23 24.62 kg/m  10/16/22 23.85 kg/m    Had about 27 lbs weight loss in 3 months, but now improving since getting home Likely due to poor p.o. intake in the low enforcement facility Needs to take protein supplements and eat at regular intervals        Meds ordered this encounter  Medications   glipiZIDE (GLUCOTROL) 5 MG tablet    Sig: Take 1 tablet (5 mg total) by mouth 2 (two) times daily before a meal.    Dispense:  60 tablet    Refill:  3    Follow-up: Return in about 3 months (around 05/30/2023).    Anabel Halon, MD

## 2023-03-01 NOTE — Patient Instructions (Addendum)
Please stop taking Metformin. Please start taking Glipizide 5 mg twice daily.  Please cut down soft drinks and take at least 64 ounces of water intake in a day.  Please continue to take other medications as prescribed.  Please continue to follow low carb diet and perform moderate exercise/walking as tolerated.  Please get blood tests done after 2-3 weeks.  Please contact your Eye doctor at  [phone].

## 2023-03-01 NOTE — Assessment & Plan Note (Addendum)
Lab Results  Component Value Date   HGBA1C 8.5 (H) 02/21/2023   Uncontrolled due to diet noncompliance, needs to cut down on soft drink intake Associated with HTN and HLD DC metformin 1000 mg twice daily due to GFR less than 30, started glipizide 5 mg twice daily Advised to follow diabetic diet On statin and ARB F/u CMP and HbA1c Diabetic eye exam: Advised to follow up with Ophthalmology for diabetic eye exam

## 2023-03-02 NOTE — Assessment & Plan Note (Signed)
BMI Readings from Last 3 Encounters:  03/01/23 24.22 kg/m  01/16/23 24.62 kg/m  10/16/22 23.85 kg/m   Had about 27 lbs weight loss in 3 months, but now improving since getting home Likely due to poor p.o. intake in the low enforcement facility Needs to take protein supplements and eat at regular intervals

## 2023-03-02 NOTE — Assessment & Plan Note (Addendum)
Likely due to dehydration, had also syncope previously Recheck BMP after 2 weeks If persistently worse kidney function, will discontinue ARB Advised to maintain adequate hydration

## 2023-03-02 NOTE — Assessment & Plan Note (Signed)
BP Readings from Last 1 Encounters:  03/01/23 133/78   Well-controlled with Azor 10-40 mg once daily Recheck BMP after 2 weeks as his AKI is likely due to poor PO intake of water If persistently worse kidney function, will discontinue ARB Counseled for compliance with the medications Advised DASH diet and moderate exercise/walking, at least 150 mins/week

## 2023-03-12 ENCOUNTER — Ambulatory Visit: Payer: Medicare HMO | Admitting: Family Medicine

## 2023-04-18 ENCOUNTER — Ambulatory Visit: Payer: Medicare HMO

## 2023-04-22 ENCOUNTER — Other Ambulatory Visit: Payer: Self-pay | Admitting: Internal Medicine

## 2023-04-22 DIAGNOSIS — K219 Gastro-esophageal reflux disease without esophagitis: Secondary | ICD-10-CM

## 2023-04-25 ENCOUNTER — Ambulatory Visit (INDEPENDENT_AMBULATORY_CARE_PROVIDER_SITE_OTHER): Payer: 59

## 2023-04-25 VITALS — Ht 70.5 in | Wt 179.0 lb

## 2023-04-25 DIAGNOSIS — Z0000 Encounter for general adult medical examination without abnormal findings: Secondary | ICD-10-CM

## 2023-04-25 DIAGNOSIS — Z5320 Procedure and treatment not carried out because of patient's decision for unspecified reasons: Secondary | ICD-10-CM

## 2023-04-25 DIAGNOSIS — F17210 Nicotine dependence, cigarettes, uncomplicated: Secondary | ICD-10-CM

## 2023-04-25 DIAGNOSIS — Z2821 Immunization not carried out because of patient refusal: Secondary | ICD-10-CM

## 2023-04-25 NOTE — Patient Instructions (Signed)
Jerry West , Thank you for taking time to come for your Medicare Wellness Visit. I appreciate your ongoing commitment to your health goals. Please review the following plan we discussed and let me know if I can assist you in the future.   Referrals/Orders/Follow-Ups/Clinician Recommendations:  Next Medicare Annual Wellness Visit:   April 29, 2024 at 9:20 am telephone visit  A lung cancer screening has been placed for you today. Please call the number below to schedule that for when it's convenient for your schedule.    [facility] [address]iology [place], [place] [address] [phone]  This is a list of the screening recommended for you and due dates:  Health Maintenance  Topic Date Due   Eye exam for diabetics  Never done   DTaP/Tdap/Td vaccine (1 - Tdap) Never done   Zoster (Shingles) Vaccine (1 of 2) Never done   COVID-19 Vaccine (1 - 2024-25 season) Never done   Pneumonia Vaccine (1 of 2 - PCV) 10/16/2023*   Complete foot exam   05/18/2023   Hemoglobin A1C  08/22/2023   Medicare Annual Wellness Visit  04/24/2024   HPV Vaccine  Aged Out   Flu Shot  Discontinued  *Topic was postponed. The date shown is not the original due date.    Advanced directives: (ACP Link)Information on Advanced Care Planning can be found at [facility] Directives Advance Health Care Directives ([email or web address]   Next Medicare Annual Wellness Visit scheduled for next year: yes  Understanding Your Risk for Falls Millions of people have serious injuries from falls each year. It is important to understand your risk of falling. Talk with your health care provider about your risk and what you can do to lower it. If you do have a serious fall, make sure to tell your provider. Falling once raises your risk of falling again. How can falls affect me? Serious injuries from falls are common. These include: Broken bones, such as hip  fractures. Head injuries, such as traumatic brain injuries (TBI) or concussions. A fear of falling can cause you to avoid activities and stay at home. This can make your muscles weaker and raise your risk for a fall. What can increase my risk? There are a number of risk factors that increase your risk for falling. The more risk factors you have, the higher your risk of falling. Serious injuries from a fall happen most often to people who are older than 87 years old. Teenagers and young adults ages 17-29 are also at higher risk. Common risk factors include: Weakness in the lower body. Being generally weak or confused due to long-term (chronic) illness. Dizziness or balance problems. Poor vision. Medicines that cause dizziness or drowsiness. These may include: Medicines for your blood pressure, heart, anxiety, insomnia, or swelling (edema). Pain medicines. Muscle relaxants. Other risk factors include: Drinking alcohol. Having had a fall in the past. Having foot pain or wearing improper footwear. Working at a dangerous job. Having any of the following in your home: Tripping hazards, such as floor clutter or loose rugs. Poor lighting. Pets. Having dementia or memory loss. What actions can I take to lower my risk of falling?     Physical activity Stay physically fit. Do strength and balance exercises. Consider taking a regular class to build strength and balance. Yoga and tai chi are good options. Vision Have your eyes checked every year and your prescription for glasses or contacts updated  as needed. Shoes and walking aids Wear non-skid shoes. Wear shoes that have rubber soles and low heels. Do not wear high heels. Do not walk around the house in socks or slippers. Use a cane or walker as told by your provider. Home safety Attach secure railings on both sides of your stairs. Install grab bars for your bathtub, shower, and toilet. Use a non-skid mat in your bathtub or shower.  Attach bath mats securely with double-sided, non-slip rug tape. Use good lighting in all rooms. Keep a flashlight near your bed. Make sure there is a clear path from your bed to the bathroom. Use night-lights. Do not use throw rugs. Make sure all carpeting is taped or tacked down securely. Remove all clutter from walkways and stairways, including extension cords. Repair uneven or broken steps and floors. Avoid walking on icy or slippery surfaces. Walk on the grass instead of on icy or slick sidewalks. Use ice melter to get rid of ice on walkways in the winter. Use a cordless phone. Questions to ask your health care provider Can you help me check my risk for a fall? Do any of my medicines make me more likely to fall? Should I take a vitamin D supplement? What exercises can I do to improve my strength and balance? Should I make an appointment to have my vision checked? Do I need a bone density test to check for weak bones (osteoporosis)? Would it help to use a cane or a walker? Where to find more information Centers for Disease Control and Prevention, STEAD[employer]: TonerPromos.no Community-Based Fall Prevention Programs: TonerPromos.no [employer] on Aging: BaseRingTones.pl Contact a health care provider if: You fall at home. You are afraid of falling at home. You feel weak, drowsy, or dizzy. This information is not intended to replace advice given to you by your health care provider. Make sure you discuss any questions you have with your health care provider. Document Revised: 10/24/2021 Document Reviewed: 10/24/2021 Elsevier Patient Education  2024 [employer].

## 2023-04-25 NOTE — Progress Notes (Signed)
Because this visit was a virtual/telehealth visit,  certain criteria was not obtained, such a blood pressure, CBG if applicable, and timed get up and go. Any medications not marked as "taking" were not mentioned during the medication reconciliation part of the visit. Any vitals not documented were not able to be obtained due to this being a telehealth visit or patient was unable to self-report a recent blood pressure reading due to a lack of equipment at home via telehealth. Vitals that have been documented are verbally provided by the patient.  Interactive audio and video telecommunications were attempted between this provider and patient, however failed, due to patient having technical difficulties OR patient did not have access to video capability.  We continued and completed visit with audio only.  Subjective:   Jerry West is a 89 y.o. male who presents for Medicare Annual/Subsequent preventive examination.  Visit Complete: Virtual I connected with  Jerry West on 04/25/23 by a audio enabled telemedicine application and verified that I am speaking with the correct person using two identifiers.  Patient Location: Home  Provider Location: Home Office  I discussed the limitations of evaluation and management by telemedicine. The patient expressed understanding and agreed to proceed.  Vital Signs: Because this visit was a virtual/telehealth visit, some criteria may be missing or patient reported. Any vitals not documented were not able to be obtained and vitals that have been documented are patient reported.  Cardiac Risk Factors include: advanced age (>104men, >42 women);diabetes mellitus;dyslipidemia;hypertension;male gender;smoking/ tobacco exposure     Objective:    Today's Vitals   04/25/23 1233  Weight: 179 lb (81.2 kg)  Height: 5' 10.5" (1.791 m)   Body mass index is 25.32 kg/m.     04/25/2023   12:37 PM 09/17/2022    6:13 PM 01/17/2022   11:16 AM  01/07/2021    8:29 AM 01/07/2020    9:48 AM 08/22/2019    5:55 PM 07/14/2019    6:38 PM  Advanced Directives  Does Patient Have a Medical Advance Directive? No No No No No No No  Would patient like information on creating a medical advance directive? No - Patient declined No - Patient declined Yes (ED - Information included in AVS) No - Patient declined No - Patient declined      Current Medications (verified) Outpatient Encounter Medications as of 04/25/2023  Medication Sig   acetaminophen (TYLENOL) 500 MG tablet Take 1 tablet (500 mg total) by mouth every 6 (six) hours as needed.   amLODipine-olmesartan (AZOR) 10-40 MG tablet TAKE 1 TABLET BY MOUTH EVERY DAY   glipiZIDE (GLUCOTROL) 5 MG tablet Take 1 tablet (5 mg total) by mouth 2 (two) times daily before a meal.   omeprazole (PRILOSEC) 40 MG capsule TAKE 1 CAPSULE (40 MG TOTAL) BY MOUTH DAILY.   rosuvastatin (CRESTOR) 40 MG tablet Take 1 tablet (40 mg total) by mouth daily.   sildenafil (VIAGRA) 25 MG tablet Take 1 tablet (25 mg total) by mouth daily as needed for erectile dysfunction.   No facility-administered encounter medications on file as of 04/25/2023.    Allergies (verified) Patient has no known allergies.   History: Past Medical History:  Diagnosis Date   Alcoholism in remission (HCC) 12/19/2016   Cancer (HCC)    prostate   Cataract    Diabetes mellitus    Type II, denies being a diabetic at this time   Elevated blood sugar 12/19/2016   History of prostate cancer 12/19/2016   HLD (  hyperlipidemia) 01/03/2017   Hypertension    Tobacco abuse 12/19/2016   Past Surgical History:  Procedure Laterality Date   APPENDECTOMY     CHOLECYSTECTOMY     FRACTURE SURGERY     Family History  Problem Relation Age of Onset   Stroke Mother    Alzheimer's disease Father    Alcohol abuse Father    Arthritis Father    Social History   Socioeconomic History   Marital status: Married    Spouse name: Talbert Forest   Number of  children: 3   Years of education: 6   Highest education level: Not on file  Occupational History   Occupation: retired    Comment: farming, mill work  Tobacco Use   Smoking status: Every Day    Current packs/day: 0.75    Average packs/day: 0.8 packs/day for 77.1 years (57.9 ttl pk-yrs)    Types: Cigarettes    Start date: 03/06/1944   Smokeless tobacco: Never  Vaping Use   Vaping status: Never Used  Substance and Sexual Activity   Alcohol use: No    Comment: prior alcoholic stopped in 1997   Drug use: No   Sexual activity: Not Currently  Other Topics Concern   Not on file  Social History Narrative   Lives with Talbert Forest 2nd wife    6th grade education   Reads "pretty good"   3 children    Close by       Enjoys: outside cutting grass        Diet: all food groups    Caffeine: 2 cups daily, sodas sometimes     Water: 6-8 cups daily       Wears seat belt   Smoke detectors    Does not use phone while driving            Social Drivers of Health   Financial Resource Strain: Low Risk  (04/25/2023)   Overall Financial Resource Strain (CARDIA)    Difficulty of Paying Living Expenses: Not hard at all  Food Insecurity: No Food Insecurity (04/25/2023)   Hunger Vital Sign    Worried About Running Out of Food in the Last Year: Never true    Ran Out of Food in the Last Year: Never true  Transportation Needs: No Transportation Needs (04/25/2023)   PRAPARE - Administrator, Civil Service (Medical): No    Lack of Transportation (Non-Medical): No  Physical Activity: Sufficiently Active (04/25/2023)   Exercise Vital Sign    Days of Exercise per Week: 7 days    Minutes of Exercise per Session: 30 min  Stress: No Stress Concern Present (04/25/2023)   [employer] of Occupational Health - Occupational Stress Questionnaire    Feeling of Stress : Not at all  Social Connections: Moderately Integrated (04/25/2023)   Social Connection and Isolation Panel [NHANES]    Frequency  of Communication with Friends and Family: Three times a week    Frequency of Social Gatherings with Friends and Family: Never    Attends Religious Services: More than 4 times per year    Active Member of Clubs or Organizations: No    Attends Engineer, structural: Never    Marital Status: Married    Tobacco Counseling Ready to quit: Yes Counseling given: Yes   Clinical Intake:  Pre-visit preparation completed: Yes  Pain : No/denies pain     BMI - recorded: 25.32 Nutritional Status: BMI 25 -29 Overweight Nutritional Risks: None Diabetes: Yes (per patient  he is no longer a diabetic but still takes glipizide) CBG done?: No (telehealth visit.) Did pt. bring in CBG monitor from home?: No  How often do you need to have someone help you when you read instructions, pamphlets, or other written materials from your doctor or pharmacy?: 3 - Sometimes  Interpreter Needed?: No  Information entered by :: Maryjean Ka CMA   Activities of Daily Living    04/25/2023   12:35 PM  In your present state of health, do you have any difficulty performing the following activities:  Hearing? 0  Vision? 0  Difficulty concentrating or making decisions? 0  Walking or climbing stairs? 0  Dressing or bathing? 0  Doing errands, shopping? 0  Preparing Food and eating ? N  Using the Toilet? N  In the past six months, have you accidently leaked urine? N  Do you have problems with loss of bowel control? N  Managing your Medications? N  Managing your Finances? N  Housekeeping or managing your Housekeeping? N    Patient Care Team: Anabel Halon, MD as PCP - General (Internal Medicine)  Indicate any recent Medical Services you may have received from other than [facility] providers in the past year (date may be approximate).     Assessment:   This is a routine wellness examination for Jadis.  Hearing/Vision screen Hearing Screening - Comments:: Patient denies any hearing difficulties.    Vision Screening - Comments:: Patient wears reading glasses only. Up to date with yearly exams.  Patient sees Dr. Daisy Lazar w/ [facility] [place] office.     Goals Addressed             This Visit's Progress    Patient Stated   On track    Stay Healthy     Patient Stated   On track    Patient states that his goal is to enjoy life        Depression Screen    04/25/2023   12:45 PM 03/01/2023    2:09 PM 01/16/2023    8:33 AM 10/16/2022    3:33 PM 09/18/2022    2:53 PM 09/18/2022    2:48 PM 09/18/2022    2:41 PM  PHQ 2/9 Scores  PHQ - 2 Score 0 2 2 4 4  0 0  PHQ- 9 Score 0 6 6 9 18  0 0    Fall Risk    04/25/2023   12:38 PM 03/01/2023    2:09 PM 01/16/2023    8:33 AM 10/16/2022    3:33 PM 09/18/2022    2:48 PM  Fall Risk   Falls in the past year? 0 0 0 0 0  Number falls in past yr: 0 0 0 0 0  Injury with Fall? 0 0 0 0 0  Risk for fall due to : No Fall Risks No Fall Risks No Fall Risks  No Fall Risks  Follow up Falls prevention discussed;Falls evaluation completed Falls evaluation completed Falls evaluation completed  Falls evaluation completed    MEDICARE RISK AT HOME: Medicare Risk at Home Any stairs in or around the home?: Yes If so, are there any without handrails?: No Home free of loose throw rugs in walkways, pet beds, electrical cords, etc?: Yes Adequate lighting in your home to reduce risk of falls?: Yes Life alert?: No Use of a cane, walker or w/c?: No Grab bars in the bathroom?: No Shower chair or bench in shower?: No Elevated toilet seat or a handicapped  toilet?: No  TIMED UP AND GO:  Was the test performed?  No    Cognitive Function:    01/07/2021    8:32 AM  MMSE - Mini Mental State Exam  Not completed: Unable to complete      08/24/2022    9:08 AM  Montreal Cognitive Assessment   Visuospatial/ Executive (0/5) 3  Naming (0/3) 3  Attention: Read list of digits (0/2) 2  Attention: Read list of letters (0/1) 1  Attention: Serial 7  subtraction starting at 100 (0/3) 0  Language: Repeat phrase (0/2) 2  Language : Fluency (0/1) 0  Abstraction (0/2) 2  Delayed Recall (0/5) 2  Orientation (0/6) 5  Total 20      04/25/2023   12:39 PM 01/17/2022   11:19 AM 01/07/2021    8:32 AM 01/07/2020    9:49 AM  6CIT Screen  What Year? 0 points 0 points 0 points 0 points  What month? 0 points 0 points 0 points 0 points  What time? 0 points 0 points 0 points 0 points  Count back from 20 0 points 0 points 0 points 0 points  Months in reverse 0 points 2 points 4 points 4 points  Repeat phrase 0 points 0 points 10 points 0 points  Total Score 0 points 2 points 14 points 4 points    Immunizations  There is no immunization history on file for this patient.  TDAP status: Due, Education has been provided regarding the importance of this vaccine. Advised may receive this vaccine at local pharmacy or Health Dept. Aware to provide a copy of the vaccination record if obtained from local pharmacy or Health Dept. Verbalized acceptance and understanding.  Flu Vaccine status: Declined, Education has been provided regarding the importance of this vaccine but patient still declined. Advised may receive this vaccine at local pharmacy or Health Dept. Aware to provide a copy of the vaccination record if obtained from local pharmacy or Health Dept. Verbalized acceptance and understanding.  Pneumococcal vaccine status: Declined,  Education has been provided regarding the importance of this vaccine but patient still declined. Advised may receive this vaccine at local pharmacy or Health Dept. Aware to provide a copy of the vaccination record if obtained from local pharmacy or Health Dept. Verbalized acceptance and understanding.   Covid-19 vaccine status: Declined, Education has been provided regarding the importance of this vaccine but patient still declined. Advised may receive this vaccine at local pharmacy or Health Dept.or vaccine clinic. Aware to  provide a copy of the vaccination record if obtained from local pharmacy or Health Dept. Verbalized acceptance and understanding.  Qualifies for Shingles Vaccine? Yes   Zostavax completed No   Shingrix Completed?: No.    Education has been provided regarding the importance of this vaccine. Patient has been advised to call insurance company to determine out of pocket expense if they have not yet received this vaccine. Advised may also receive vaccine at local pharmacy or Health Dept. Verbalized acceptance and understanding.  Screening Tests Health Maintenance  Topic Date Due   OPHTHALMOLOGY EXAM  Never done   DTaP/Tdap/Td (1 - Tdap) Never done   Zoster Vaccines- Shingrix (1 of 2) Never done   COVID-19 Vaccine (1 - 2024-25 season) Never done   Pneumonia Vaccine 70+ Years old (1 of 2 - PCV) 10/16/2023 (Originally 11/13/1940)   FOOT EXAM  05/18/2023   HEMOGLOBIN A1C  08/22/2023   Medicare Annual Wellness (AWV)  04/24/2024   HPV VACCINES  Aged Out   INFLUENZA VACCINE  Discontinued    Health Maintenance  Health Maintenance Due  Topic Date Due   OPHTHALMOLOGY EXAM  Never done   DTaP/Tdap/Td (1 - Tdap) Never done   Zoster Vaccines- Shingrix (1 of 2) Never done   COVID-19 Vaccine (1 - 2024-25 season) Never done    Colorectal cancer screening: No longer required.   Lung Cancer Screening: (Low Dose CT Chest recommended if Age 30-80 years, 20 pack-year currently smoking OR have quit w/in 15years.) does qualify.   Lung Cancer Screening Referral: 04/25/2023  Additional Screening:  Hepatitis C Screening: does not qualify;   Vision Screening: Recommended annual ophthalmology exams for early detection of glaucoma and other disorders of the eye. Is the patient up to date with their annual eye exam?  No  Who is the provider or what is the name of the office in which the patient attends annual eye exams? [facility] If pt is not established with a provider, would they like to be  referred to a provider to establish care? No .   Dental Screening: Recommended annual dental exams for proper oral hygiene  Diabetic Foot Exam: Diabetic Foot Exam: Completed 05/18/2022  Community Resource Referral / Chronic Care Management: CRR required this visit?  No   CCM required this visit?  No     Plan:     I have personally reviewed and noted the following in the patient's chart:   Medical and social history Use of alcohol, tobacco or illicit drugs  Current medications and supplements including opioid prescriptions. Patient is not currently taking opioid prescriptions. Functional ability and status Nutritional status Physical activity Advanced directives List of other physicians Hospitalizations, surgeries, and ER visits in previous 12 months Vitals Screenings to include cognitive, depression, and falls Referrals and appointments  In addition, I have reviewed and discussed with patient certain preventive protocols, quality metrics, and best practice recommendations. A written personalized care plan for preventive services as well as general preventive health recommendations were provided to patient.     Jordan Hawks Erynne Kealey, CMA   04/25/2023   After Visit Summary: (Mail) Due to this being a telephonic visit, the after visit summary with patients personalized plan was offered to patient via mail   Nurse Notes: see routing comment

## 2023-05-04 ENCOUNTER — Other Ambulatory Visit: Payer: Self-pay | Admitting: Internal Medicine

## 2023-05-04 DIAGNOSIS — I10 Essential (primary) hypertension: Secondary | ICD-10-CM

## 2023-05-13 ENCOUNTER — Other Ambulatory Visit: Payer: Self-pay | Admitting: Internal Medicine

## 2023-05-13 DIAGNOSIS — E782 Mixed hyperlipidemia: Secondary | ICD-10-CM

## 2023-05-13 DIAGNOSIS — E1169 Type 2 diabetes mellitus with other specified complication: Secondary | ICD-10-CM

## 2023-05-31 ENCOUNTER — Ambulatory Visit: Payer: Medicare HMO | Admitting: Internal Medicine

## 2023-06-24 ENCOUNTER — Other Ambulatory Visit: Payer: Self-pay | Admitting: Internal Medicine

## 2023-06-24 DIAGNOSIS — E1169 Type 2 diabetes mellitus with other specified complication: Secondary | ICD-10-CM

## 2023-07-07 ENCOUNTER — Other Ambulatory Visit: Payer: Self-pay | Admitting: Internal Medicine

## 2023-07-07 DIAGNOSIS — I10 Essential (primary) hypertension: Secondary | ICD-10-CM

## 2023-08-08 ENCOUNTER — Telehealth: Payer: Self-pay | Admitting: Internal Medicine

## 2023-08-08 NOTE — Telephone Encounter (Signed)
 In June of 2024 results of MoCA

## 2023-08-08 NOTE — Telephone Encounter (Signed)
 Copied from CRM [id number]. Topic: Medical Record Request - Records Request >> Aug 07, 2023  4:54 PM Jerry West wrote: Pt and his daughter Jerry West [facility]) [phone] requesting results from memory test done in 2024 please contact daughter with instructions on how to obtain these results.

## 2023-08-08 NOTE — Telephone Encounter (Signed)
 Called and LVM on the date and score of on his last MoCA assessment and that the score indicated mild cognitive impairement. The scores are as follows 18-25 = mild cognitive impairement His score in June 2024 was 18 Advised to call back with any concerns

## 2023-08-10 ENCOUNTER — Telehealth: Payer: Self-pay

## 2023-08-10 NOTE — Telephone Encounter (Signed)
 Pt scheduled for soonest available 7/9 3:40

## 2023-08-10 NOTE — Telephone Encounter (Signed)
 Patient was identified as falling into the [place] Measure - Diabetes.   Patient was: Requires a call back at a later time.  Patient missed 3 month follow up in March 2025. Due for DM/A1C f/u with PCP. Please contact patient for scheduling.  Last A1c 02/21/23 - 8.5

## 2023-08-13 ENCOUNTER — Telehealth: Payer: Self-pay

## 2023-08-13 NOTE — Telephone Encounter (Signed)
 Spoke to pt's daughter she would like his cognitive test faxed to her at [phone]. faxed.

## 2023-08-13 NOTE — Telephone Encounter (Signed)
 Copied from CRM [id number]. Topic: Medical Record Request - Records Request >> Aug 07, 2023  4:54 PM Virgia Griffins wrote: Pt and his daughter Jerry West [facility]) [phone] requesting results from memory test done in 2024 please contact daughter with instructions on how to obtain these results.

## 2023-08-23 ENCOUNTER — Other Ambulatory Visit: Payer: Self-pay | Admitting: Internal Medicine

## 2023-08-23 DIAGNOSIS — E782 Mixed hyperlipidemia: Secondary | ICD-10-CM

## 2023-08-23 DIAGNOSIS — K219 Gastro-esophageal reflux disease without esophagitis: Secondary | ICD-10-CM

## 2023-09-12 ENCOUNTER — Ambulatory Visit: Admitting: Internal Medicine

## 2023-09-12 ENCOUNTER — Other Ambulatory Visit: Payer: Self-pay | Admitting: Internal Medicine

## 2023-09-12 DIAGNOSIS — I10 Essential (primary) hypertension: Secondary | ICD-10-CM

## 2023-11-01 ENCOUNTER — Other Ambulatory Visit: Payer: Self-pay | Admitting: Internal Medicine

## 2023-11-01 DIAGNOSIS — E1169 Type 2 diabetes mellitus with other specified complication: Secondary | ICD-10-CM

## 2023-11-03 ENCOUNTER — Other Ambulatory Visit: Payer: Self-pay | Admitting: Internal Medicine

## 2023-11-03 DIAGNOSIS — I10 Essential (primary) hypertension: Secondary | ICD-10-CM

## 2023-12-26 ENCOUNTER — Other Ambulatory Visit: Payer: Self-pay | Admitting: Internal Medicine

## 2023-12-26 DIAGNOSIS — K219 Gastro-esophageal reflux disease without esophagitis: Secondary | ICD-10-CM

## 2024-04-04 ENCOUNTER — Other Ambulatory Visit: Payer: Self-pay | Admitting: Internal Medicine

## 2024-04-04 DIAGNOSIS — I10 Essential (primary) hypertension: Secondary | ICD-10-CM

## 2024-04-29 ENCOUNTER — Ambulatory Visit: Payer: 59
